# Patient Record
Sex: Female | Born: 1935 | Race: White | Hispanic: No | Smoking: Never smoker
Health system: Southern US, Community
[De-identification: ages and names within clinical notes are randomized; demographics above are authoritative.]

## PROBLEM LIST (undated history)

## (undated) DIAGNOSIS — K5730 Diverticulosis of large intestine without perforation or abscess without bleeding: Secondary | ICD-10-CM

## (undated) DIAGNOSIS — E785 Hyperlipidemia, unspecified: Secondary | ICD-10-CM

## (undated) DIAGNOSIS — H353190 Nonexudative age-related macular degeneration, unspecified eye, stage unspecified: Secondary | ICD-10-CM

## (undated) DIAGNOSIS — R32 Unspecified urinary incontinence: Secondary | ICD-10-CM

## (undated) DIAGNOSIS — I2510 Atherosclerotic heart disease of native coronary artery without angina pectoris: Secondary | ICD-10-CM

## (undated) DIAGNOSIS — I517 Cardiomegaly: Secondary | ICD-10-CM

## (undated) DIAGNOSIS — E039 Hypothyroidism, unspecified: Secondary | ICD-10-CM

## (undated) DIAGNOSIS — I340 Nonrheumatic mitral (valve) insufficiency: Secondary | ICD-10-CM

## (undated) DIAGNOSIS — M1990 Unspecified osteoarthritis, unspecified site: Secondary | ICD-10-CM

## (undated) DIAGNOSIS — G459 Transient cerebral ischemic attack, unspecified: Secondary | ICD-10-CM

## (undated) DIAGNOSIS — I272 Pulmonary hypertension, unspecified: Secondary | ICD-10-CM

## (undated) DIAGNOSIS — K219 Gastro-esophageal reflux disease without esophagitis: Secondary | ICD-10-CM

## (undated) DIAGNOSIS — R55 Syncope and collapse: Secondary | ICD-10-CM

## (undated) DIAGNOSIS — I422 Other hypertrophic cardiomyopathy: Secondary | ICD-10-CM

## (undated) DIAGNOSIS — M549 Dorsalgia, unspecified: Secondary | ICD-10-CM

## (undated) DIAGNOSIS — I10 Essential (primary) hypertension: Secondary | ICD-10-CM

## (undated) DIAGNOSIS — Z9889 Other specified postprocedural states: Secondary | ICD-10-CM

## (undated) DIAGNOSIS — D126 Benign neoplasm of colon, unspecified: Secondary | ICD-10-CM

## (undated) HISTORY — DX: Gastro-esophageal reflux disease without esophagitis: K21.9

## (undated) HISTORY — DX: Pulmonary hypertension, unspecified: I27.20

## (undated) HISTORY — DX: Benign neoplasm of colon, unspecified: D12.6

## (undated) HISTORY — DX: Nonrheumatic mitral (valve) insufficiency: I34.0

## (undated) HISTORY — DX: Dorsalgia, unspecified: M54.9

## (undated) HISTORY — DX: Other hypertrophic cardiomyopathy: I42.2

## (undated) HISTORY — DX: Essential (primary) hypertension: I10

## (undated) HISTORY — DX: Unspecified urinary incontinence: R32

## (undated) HISTORY — DX: Cardiomegaly: I51.7

## (undated) HISTORY — PX: OTHER SURGICAL HISTORY: SHX169

## (undated) HISTORY — PX: DILATION AND CURETTAGE OF UTERUS: SHX78

## (undated) HISTORY — PX: CATARACT EXTRACTION, BILATERAL: SHX1313

## (undated) HISTORY — DX: Atherosclerotic heart disease of native coronary artery without angina pectoris: I25.10

## (undated) HISTORY — DX: Diverticulosis of large intestine without perforation or abscess without bleeding: K57.30

## (undated) HISTORY — PX: LUNG BIOPSY: SHX232

## (undated) HISTORY — DX: Other specified postprocedural states: Z98.890

## (undated) HISTORY — DX: Syncope and collapse: R55

## (undated) HISTORY — PX: COLONOSCOPY: SHX174

## (undated) HISTORY — DX: Transient cerebral ischemic attack, unspecified: G45.9

---

## 1998-01-26 ENCOUNTER — Encounter: Payer: Self-pay | Admitting: Internal Medicine

## 1998-01-26 ENCOUNTER — Ambulatory Visit (HOSPITAL_COMMUNITY): Admission: RE | Admit: 1998-01-26 | Discharge: 1998-01-26 | Payer: Self-pay | Admitting: Obstetrics & Gynecology

## 1998-01-26 ENCOUNTER — Ambulatory Visit (HOSPITAL_COMMUNITY): Admission: RE | Admit: 1998-01-26 | Discharge: 1998-01-26 | Payer: Self-pay | Admitting: Internal Medicine

## 1999-01-25 ENCOUNTER — Ambulatory Visit (HOSPITAL_COMMUNITY): Admission: RE | Admit: 1999-01-25 | Discharge: 1999-01-25 | Payer: Self-pay | Admitting: Obstetrics & Gynecology

## 1999-01-25 ENCOUNTER — Ambulatory Visit (HOSPITAL_COMMUNITY): Admission: RE | Admit: 1999-01-25 | Discharge: 1999-01-25 | Payer: Self-pay | Admitting: Internal Medicine

## 1999-03-12 ENCOUNTER — Encounter: Payer: Self-pay | Admitting: Internal Medicine

## 1999-03-12 ENCOUNTER — Ambulatory Visit (HOSPITAL_COMMUNITY): Admission: RE | Admit: 1999-03-12 | Discharge: 1999-03-12 | Payer: Self-pay | Admitting: Internal Medicine

## 2000-03-13 ENCOUNTER — Ambulatory Visit (HOSPITAL_COMMUNITY): Admission: RE | Admit: 2000-03-13 | Discharge: 2000-03-13 | Payer: Self-pay | Admitting: *Deleted

## 2000-03-13 ENCOUNTER — Ambulatory Visit (HOSPITAL_COMMUNITY): Admission: RE | Admit: 2000-03-13 | Discharge: 2000-03-13 | Payer: Self-pay | Admitting: Internal Medicine

## 2001-04-09 ENCOUNTER — Ambulatory Visit (HOSPITAL_COMMUNITY): Admission: RE | Admit: 2001-04-09 | Discharge: 2001-04-09 | Payer: Self-pay | Admitting: Internal Medicine

## 2001-04-09 ENCOUNTER — Ambulatory Visit (HOSPITAL_COMMUNITY): Admission: RE | Admit: 2001-04-09 | Discharge: 2001-04-09 | Payer: Self-pay | Admitting: Obstetrics & Gynecology

## 2002-04-12 ENCOUNTER — Ambulatory Visit (HOSPITAL_COMMUNITY): Admission: RE | Admit: 2002-04-12 | Discharge: 2002-04-12 | Payer: Self-pay | Admitting: Internal Medicine

## 2003-03-03 ENCOUNTER — Encounter (INDEPENDENT_AMBULATORY_CARE_PROVIDER_SITE_OTHER): Payer: Self-pay | Admitting: Specialist

## 2003-03-03 ENCOUNTER — Ambulatory Visit: Admission: RE | Admit: 2003-03-03 | Discharge: 2003-03-03 | Payer: Self-pay | Admitting: Internal Medicine

## 2004-04-19 ENCOUNTER — Ambulatory Visit: Payer: Self-pay | Admitting: Internal Medicine

## 2004-07-17 ENCOUNTER — Ambulatory Visit: Payer: Self-pay | Admitting: Internal Medicine

## 2004-12-27 ENCOUNTER — Ambulatory Visit: Payer: Self-pay | Admitting: Internal Medicine

## 2005-01-20 ENCOUNTER — Ambulatory Visit: Payer: Self-pay | Admitting: Internal Medicine

## 2005-02-06 ENCOUNTER — Encounter (INDEPENDENT_AMBULATORY_CARE_PROVIDER_SITE_OTHER): Payer: Self-pay | Admitting: Specialist

## 2005-02-06 ENCOUNTER — Ambulatory Visit: Payer: Self-pay | Admitting: Internal Medicine

## 2005-02-06 DIAGNOSIS — D126 Benign neoplasm of colon, unspecified: Secondary | ICD-10-CM

## 2005-02-06 HISTORY — DX: Benign neoplasm of colon, unspecified: D12.6

## 2005-06-25 ENCOUNTER — Ambulatory Visit: Payer: Self-pay | Admitting: Internal Medicine

## 2005-06-27 ENCOUNTER — Ambulatory Visit: Payer: Self-pay | Admitting: Cardiology

## 2005-09-17 ENCOUNTER — Ambulatory Visit: Payer: Self-pay | Admitting: Internal Medicine

## 2005-11-12 ENCOUNTER — Encounter (INDEPENDENT_AMBULATORY_CARE_PROVIDER_SITE_OTHER): Payer: Self-pay | Admitting: *Deleted

## 2005-11-12 ENCOUNTER — Ambulatory Visit: Payer: Self-pay | Admitting: Internal Medicine

## 2005-11-12 ENCOUNTER — Ambulatory Visit: Admission: RE | Admit: 2005-11-12 | Discharge: 2005-11-12 | Payer: Self-pay | Admitting: Internal Medicine

## 2005-11-20 ENCOUNTER — Ambulatory Visit: Payer: Self-pay | Admitting: Internal Medicine

## 2006-01-01 ENCOUNTER — Ambulatory Visit: Payer: Self-pay | Admitting: Internal Medicine

## 2006-01-29 ENCOUNTER — Ambulatory Visit: Payer: Self-pay | Admitting: Internal Medicine

## 2006-06-30 ENCOUNTER — Ambulatory Visit: Payer: Self-pay | Admitting: Internal Medicine

## 2006-09-30 ENCOUNTER — Ambulatory Visit: Payer: Self-pay | Admitting: Vascular Surgery

## 2006-10-07 ENCOUNTER — Ambulatory Visit: Payer: Self-pay | Admitting: Vascular Surgery

## 2006-10-28 ENCOUNTER — Ambulatory Visit: Payer: Self-pay | Admitting: Vascular Surgery

## 2006-11-04 ENCOUNTER — Ambulatory Visit: Payer: Self-pay | Admitting: Vascular Surgery

## 2006-12-18 ENCOUNTER — Ambulatory Visit: Payer: Self-pay | Admitting: Vascular Surgery

## 2006-12-30 DIAGNOSIS — J479 Bronchiectasis, uncomplicated: Secondary | ICD-10-CM

## 2006-12-30 DIAGNOSIS — E785 Hyperlipidemia, unspecified: Secondary | ICD-10-CM

## 2006-12-30 DIAGNOSIS — E039 Hypothyroidism, unspecified: Secondary | ICD-10-CM

## 2006-12-31 ENCOUNTER — Ambulatory Visit: Payer: Self-pay | Admitting: Internal Medicine

## 2007-05-27 ENCOUNTER — Encounter: Admission: RE | Admit: 2007-05-27 | Discharge: 2007-05-27 | Payer: Self-pay | Admitting: Internal Medicine

## 2007-09-15 ENCOUNTER — Encounter: Admission: RE | Admit: 2007-09-15 | Discharge: 2007-09-15 | Payer: Self-pay | Admitting: Cardiology

## 2007-09-16 ENCOUNTER — Ambulatory Visit: Payer: Self-pay | Admitting: Surgery

## 2008-02-09 ENCOUNTER — Ambulatory Visit: Payer: Self-pay | Admitting: Internal Medicine

## 2008-09-24 HISTORY — PX: CARDIAC CATHETERIZATION: SHX172

## 2008-10-20 ENCOUNTER — Inpatient Hospital Stay (HOSPITAL_BASED_OUTPATIENT_CLINIC_OR_DEPARTMENT_OTHER): Admission: RE | Admit: 2008-10-20 | Discharge: 2008-10-20 | Payer: Self-pay | Admitting: Cardiology

## 2008-11-01 ENCOUNTER — Ambulatory Visit: Payer: Self-pay | Admitting: Vascular Surgery

## 2008-11-03 ENCOUNTER — Observation Stay (HOSPITAL_COMMUNITY): Admission: EM | Admit: 2008-11-03 | Discharge: 2008-11-04 | Payer: Self-pay | Admitting: Emergency Medicine

## 2008-11-16 ENCOUNTER — Encounter: Admission: RE | Admit: 2008-11-16 | Discharge: 2008-11-16 | Payer: Self-pay | Admitting: Cardiology

## 2009-10-16 ENCOUNTER — Ambulatory Visit: Payer: Self-pay | Admitting: Cardiology

## 2009-11-27 ENCOUNTER — Encounter: Admission: RE | Admit: 2009-11-27 | Discharge: 2009-11-27 | Payer: Self-pay | Admitting: Internal Medicine

## 2010-02-19 ENCOUNTER — Ambulatory Visit: Payer: Self-pay | Admitting: Cardiology

## 2010-03-11 ENCOUNTER — Ambulatory Visit: Payer: Self-pay | Admitting: Cardiology

## 2010-04-09 ENCOUNTER — Ambulatory Visit (INDEPENDENT_AMBULATORY_CARE_PROVIDER_SITE_OTHER): Payer: Medicare HMO | Admitting: *Deleted

## 2010-04-09 DIAGNOSIS — I10 Essential (primary) hypertension: Secondary | ICD-10-CM

## 2010-04-09 DIAGNOSIS — R5381 Other malaise: Secondary | ICD-10-CM

## 2010-04-09 DIAGNOSIS — R5383 Other fatigue: Secondary | ICD-10-CM

## 2010-05-31 LAB — CARDIAC PANEL(CRET KIN+CKTOT+MB+TROPI)
CK, MB: 2.5 ng/mL (ref 0.3–4.0)
CK, MB: 2.9 ng/mL (ref 0.3–4.0)
Relative Index: 2.1 (ref 0.0–2.5)
Troponin I: 0.02 ng/mL (ref 0.00–0.06)

## 2010-05-31 LAB — CBC
HCT: 36.5 % (ref 36.0–46.0)
Hemoglobin: 12.3 g/dL (ref 12.0–15.0)
Hemoglobin: 13.1 g/dL (ref 12.0–15.0)
MCHC: 33.3 g/dL (ref 30.0–36.0)
MCHC: 33.5 g/dL (ref 30.0–36.0)
MCV: 91.5 fL (ref 78.0–100.0)
Platelets: 215 10*3/uL (ref 150–400)
RBC: 3.99 MIL/uL (ref 3.87–5.11)
RDW: 13.6 % (ref 11.5–15.5)
WBC: 6.4 10*3/uL (ref 4.0–10.5)

## 2010-05-31 LAB — URINALYSIS, ROUTINE W REFLEX MICROSCOPIC
Glucose, UA: NEGATIVE mg/dL
Hgb urine dipstick: NEGATIVE
Protein, ur: NEGATIVE mg/dL
pH: 7 (ref 5.0–8.0)

## 2010-05-31 LAB — COMPREHENSIVE METABOLIC PANEL
Albumin: 2.8 g/dL — ABNORMAL LOW (ref 3.5–5.2)
BUN: 9 mg/dL (ref 6–23)
Calcium: 8.8 mg/dL (ref 8.4–10.5)
Glucose, Bld: 143 mg/dL — ABNORMAL HIGH (ref 70–99)
Sodium: 137 mEq/L (ref 135–145)
Total Protein: 6.7 g/dL (ref 6.0–8.3)

## 2010-05-31 LAB — DIFFERENTIAL
Basophils Relative: 1 % (ref 0–1)
Eosinophils Absolute: 0.2 10*3/uL (ref 0.0–0.7)
Eosinophils Relative: 3 % (ref 0–5)
Lymphs Abs: 1.2 10*3/uL (ref 0.7–4.0)
Monocytes Absolute: 0.4 10*3/uL (ref 0.1–1.0)
Neutro Abs: 4.5 10*3/uL (ref 1.7–7.7)

## 2010-05-31 LAB — BASIC METABOLIC PANEL
CO2: 27 mEq/L (ref 19–32)
Chloride: 104 mEq/L (ref 96–112)
Creatinine, Ser: 0.83 mg/dL (ref 0.4–1.2)
GFR calc Af Amer: >60 mL/min (ref >60)
Potassium: 4.2 mEq/L (ref 3.5–5.1)

## 2010-05-31 LAB — APTT: aPTT: 37 seconds (ref 24–37)

## 2010-05-31 LAB — TROPONIN I: Troponin I: 0.02 ng/mL (ref 0.00–0.06)

## 2010-05-31 LAB — CK TOTAL AND CKMB (NOT AT ARMC): Total CK: 114 U/L (ref 7–177)

## 2010-05-31 LAB — POCT CARDIAC MARKERS
CKMB, poc: 1.2 ng/mL (ref 1.0–8.0)
Myoglobin, poc: 135 ng/mL (ref 12–200)
Troponin i, poc: <0.05 ng/mL (ref 0.00–0.09)

## 2010-06-20 ENCOUNTER — Telehealth: Payer: Self-pay | Admitting: Cardiology

## 2010-06-20 NOTE — Telephone Encounter (Signed)
Toprol/not generic  Pls.call to her mail order pharm

## 2010-06-20 NOTE — Telephone Encounter (Signed)
Toprol XL 100 mg one po daily # 90 sent in to [facility].  Pt aware.

## 2010-07-04 ENCOUNTER — Encounter: Payer: Self-pay | Admitting: Cardiology

## 2010-07-04 DIAGNOSIS — I2510 Atherosclerotic heart disease of native coronary artery without angina pectoris: Secondary | ICD-10-CM | POA: Insufficient documentation

## 2010-07-04 DIAGNOSIS — I272 Pulmonary hypertension, unspecified: Secondary | ICD-10-CM | POA: Insufficient documentation

## 2010-07-04 DIAGNOSIS — G459 Transient cerebral ischemic attack, unspecified: Secondary | ICD-10-CM | POA: Insufficient documentation

## 2010-07-04 DIAGNOSIS — I10 Essential (primary) hypertension: Secondary | ICD-10-CM | POA: Insufficient documentation

## 2010-07-04 DIAGNOSIS — M549 Dorsalgia, unspecified: Secondary | ICD-10-CM | POA: Insufficient documentation

## 2010-07-04 DIAGNOSIS — K209 Esophagitis, unspecified without bleeding: Secondary | ICD-10-CM | POA: Insufficient documentation

## 2010-07-04 DIAGNOSIS — I422 Other hypertrophic cardiomyopathy: Secondary | ICD-10-CM | POA: Insufficient documentation

## 2010-07-04 DIAGNOSIS — Z9889 Other specified postprocedural states: Secondary | ICD-10-CM | POA: Insufficient documentation

## 2010-07-04 DIAGNOSIS — K219 Gastro-esophageal reflux disease without esophagitis: Secondary | ICD-10-CM | POA: Insufficient documentation

## 2010-07-04 DIAGNOSIS — R0602 Shortness of breath: Secondary | ICD-10-CM | POA: Insufficient documentation

## 2010-07-04 DIAGNOSIS — R55 Syncope and collapse: Secondary | ICD-10-CM | POA: Insufficient documentation

## 2010-07-04 DIAGNOSIS — R519 Headache, unspecified: Secondary | ICD-10-CM | POA: Insufficient documentation

## 2010-07-04 DIAGNOSIS — R32 Unspecified urinary incontinence: Secondary | ICD-10-CM | POA: Insufficient documentation

## 2010-07-04 DIAGNOSIS — I340 Nonrheumatic mitral (valve) insufficiency: Secondary | ICD-10-CM | POA: Insufficient documentation

## 2010-07-04 DIAGNOSIS — R51 Headache: Secondary | ICD-10-CM | POA: Insufficient documentation

## 2010-07-09 NOTE — Cardiovascular Report (Signed)
[name], [name]                 ACCOUNT NO.:  [id number]   MEDICAL RECORD NO.:  [id number]          PATIENT TYPE:  OIB   LOCATION:  1962                         FACILITY:  [facility]   PHYSICIAN:  Colleen Can. Deborah Chalk, M.D.DATE OF BIRTH:  30-May-1924   DATE OF PROCEDURE:  10/20/2008  DATE OF DISCHARGE:  10/20/2008                            CARDIAC CATHETERIZATION   PROCEDURES:  Left heart catheterization with selective coronary  angiography and left ventricular angiography.   SITE OF ENTRY:  Percutaneous right femoral artery.   CATHETERS:  4-French 4 curved Judkins left coronary catheter, 3D RC  right coronary catheter, and 4-French pigtail ventriculographic  catheter.   CONTRAST MATERIAL:  Omnipaque.   MEDICATIONS GIVEN PRIOR TO PROCEDURE:  Valium 10 mg p.o.   MEDICATIONS GIVEN DURING PROCEDURE:  1. Versed 2 mg IV.  2. Labetalol 10 mg IV.   COMMENTS:  The patient tolerated the procedure well.   HEMODYNAMIC DATA:  The aortic pressure was 178/78, LV was 170/20.  There  is no aortic valve gradient noted on pullback.   ANGIOGRAPHIC DATA:  Left ventricular angiogram was performed in the RAO  position.  Overall cardiac size and silhouette are normal.  The global  ejection fraction is estimated to be 70%.   CORONARY ARTERIES:  Coronary arteries arise and distribute normally, but  there was prominent thebesian vein flow to the point that the left  ventricular angiogram can almost be identified with an injection of both  the right coronary artery and the left coronary artery.  This is  certainly more prominent than normal.   1. Left main coronary artery has 30-40% distal narrowing.  2. Left anterior descending is tortuous with irregularities.  There is      a proximal systolic bridging reducing diameter to approximately 50%      proximally.  There is no significant focal stenosis, otherwise.  3. Left circumflex is tortuous with irregularities.  4. Right coronary artery has 30-50%  narrowing proximally.  It is a      dominant vessel.   All coronaries have prominent thebesian flow.   OVERALL IMPRESSION:  1. Normal left ventricular function, but with mild elevation of left      ventricular end-diastolic pressure.  2. Mild-to-moderate coronary atherosclerosis with 30-50% narrowing in      the right coronary artery, 30-40% narrowing in the left main      coronary artery, and irregularities, otherwise.  3. 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  4. Prominent thebesian vein flow.   DISCUSSION:  Laurie [name] does not appear to need any type of intervention  at this point in time and will need to be managed medically.  She has  had a history of mild septal hypertrophy with her last echocardiogram  and mild hypertension.  She is on prominent beta-blocker at this point  in time, and I think that probably is adequate therapy for now.  Literature will need to be reviewed.      Colleen Can. Deborah Chalk, M.D.  Electronically Signed     SNT/MEDQ  D:  10/20/2008  T:  10/21/2008  Job:  [id number]

## 2010-07-09 NOTE — Assessment & Plan Note (Signed)
OFFICE VISIT   Leitch, Bowie A  DOB:  [birth year]                                       12/18/2006  UXLKG#:[id number]   Laurie [name] presents today for final follow-up after her right leg laser  ablation staph phlebectomy for severe venous hypertension and varicose  veins.  She has resolved all the usual postoperative bruising.  Her  procedure was on 10/28/06. She does have some continued mild amount of  blood under the skin and this will continue to resolve.  She is pleased  with her result as am I.  We will see her again on an as needed basis.   Larina Earthly, M.D.  Electronically Signed   TFE/MEDQ  D:  12/18/2006  T:  12/22/2006  Job:  [id number]   cc:   Eber Hong

## 2010-07-09 NOTE — Procedures (Signed)
CAROTID DUPLEX EXAM   INDICATION:  TIA.   HISTORY:  Diabetes:  No.  Cardiac:  No.  Hypertension:  Yes.  Smoking:  No.  Previous Surgery:  No.  CV History:  Patient complains of left-sided weakness.  Previous duplex  on 08/03/07 revealed 20-39% ICA stenosis bilaterally.  Amaurosis Fugax No, Paresthesias No, Hemiparesis No.                                       RIGHT             LEFT  Brachial systolic pressure:         178               178  Brachial Doppler waveforms:         Triphasic         Triphasic  Vertebral direction of flow:        Antegrade         Antegrade  DUPLEX VELOCITIES (cm/sec)  CCA peak systolic                   52                50  ECA peak systolic                   43                41  ICA peak systolic                   39                65  ICA end diastolic                   10                17  PLAQUE MORPHOLOGY:                  Soft              Soft  PLAQUE AMOUNT:                      Mild              Mild  PLAQUE LOCATION:                    Proximal ICA      Proximal ICA   IMPRESSION:  1. 20-39% internal carotid artery stenosis bilaterally.  2. No significant change from the previous study.   ___________________________________________  Larina Earthly, M.D.   MC/MEDQ  D:  09/16/2007  T:  09/16/2007  Job:  [id number]

## 2010-07-09 NOTE — Assessment & Plan Note (Signed)
[facility]                             PULMONARY OFFICE NOTE   NAME:Laurie [name], Laurie [name]                        MRN:          [id number]  DATE:12/31/2006                            DOB:          10-Jan-1925    HISTORY:  A 77 year old white female, never smoker, with bilateral  airspace disease involving the right upper lobe and lingula, documented  since November of 2004 with bronchoscopy x2 with no specific findings.  She returns today saying she has not had any cough or chest pain,  fevers, chills, sweats, unintended weight loss, orthopnea, PND or leg  swelling or dyspnea since her previous visit and returns for followup  chest x-ray as requested.   For full inventory of medications, please see face sheet, column dated  December 31, 2006.  She has a medication calendar generated from our  office, but is not carrying it with her to the doctor's as I had  recommended.   PHYSICAL EXAMINATION:  She is a pleasant, ambulatory white female in no  acute distress.  She is afebrile with normal vital signs.  HEENT:  Unremarkable.  Oropharynx is clear.  Lung fields are clear bilaterally to auscultation and percussion.  I  cannot appreciate any adventitious breath sounds at all.  HEART:  Regular rhythm without murmurs, gallops, or rubs.  ABDOMEN:  Soft, benign.  EXTREMITIES:  Warm without calf tenderness, cyanosis, clubbing, or  edema.   Chest x-ray is pending.   IMPRESSION:  Bronchiectasis with scarring involving the right upper lobe  and lingula.  Although radiology has raised the issue of alveolar cell  carcinoma, I think this is extremely unlikely based on the 2 negative  bronchoscopies that she has had, and would recommend followup chest x-  ray at 6 months unless symptoms develop in the meantime.   I note that she does have p.r.n. Mucinex to use for cough and  congestion, but has not found that she needs it lately.  I have deferred  vaccinations to Dr.  Lanell Matar capable hands.     Charlaine Dalton. Sherene Sires, MD, [facility]  Electronically Signed    MBW/MedQ  DD: 12/31/2006  DT: 12/31/2006  Job #: [id number]   cc:   Joni Fears D. Maple Hudson, MD, FCCP, FACP

## 2010-07-09 NOTE — H&P (Signed)
[name], [name]                 ACCOUNT NO.:  [id number]   MEDICAL RECORD NO.:  [id number]          PATIENT TYPE:  JCAR   LOCATION:                               FACILITY:  [facility]   PHYSICIAN:  Colleen Can. Deborah Chalk, M.D.DATE OF BIRTH:  [birth year]   DATE OF ADMISSION:  10/20/2008  DATE OF DISCHARGE:                              HISTORY & PHYSICAL   CHIEF COMPLAINT:  Chest pain.   HISTORY OF PRESENT ILLNESS:  Laurie [name] is a very pleasant elderly 77 year old  white female who is referred for cardiac catheterization.  She has had  intermittent chest discomfort off and on for quite some time, but then  had significant episode this past Thursday evening.  She notes that when  she went to go to bed Thursday night, she had substernal chest  discomfort.  It was associated with some nausea.  She got extremely  worried.  She took 2 Tums with no relief and then subsequently took 4  baby aspirin and was able to drift off to sleep.  The discomfort lasted  for at least 1 hour.  She has had recurrent twinges since that time.  Her EKG performed here in the office does show T-wave changes  anterolaterally.  She is now referred for cardiac catheterization.   PAST MEDICAL HISTORY:  1. Hypertension.  2. Mild septal hypertrophy with her last echo in July 2009.  3. Moderate mitral regurgitation.  4. Mild pulmonary hypertension.  5. Situational stress with husband's death in 09-Mar-2007.  6. Remote TIA.  7. Remote history of syncope.  8. Vein stripping in 1970.  9. Childbirth x4.  10.Bilateral cataract surgery.   ALLERGIES:  AMOXICILLIN AND LODINE.   CURRENT MEDICINES:  Lopid 600 b.i.d., Synthroid 50 mcg daily,  multivitamin daily, Toprol XL 200 mg a day, Aleve as needed, Diovan 160  a day, Tylenol Arthritis p.r.n., baby aspirin daily, and vitamin C and D  daily.   FAMILY HISTORY:  Father died of prostate cancer and hypertension at the  age of 72.  Her mother died of heart disease at age 43.  She has  had  brothers who have had bypass surgery.   SOCIAL HISTORY:  She is retired from being overlord.  She does not use  any tobacco or alcohol products.   REVIEW OF SYSTEMS:  Positive for fatigue, shortness of breath with  walking upstairs, some dry cough, bruising easily, back pain, and  weakness.  All other review of systems are negative.   PHYSICAL EXAMINATION:  GENERAL:  On exam, she is pleasant elderly white  female in no acute distress.  She is currently pain free.  VITAL SIGNS:  Weight 138 pounds.  Blood pressure 122/76 sitting and  118/72 standing.  Heart rate 56 and regular.  SKIN:  Warm and dry.  Color is unremarkable.  HEENT:  Normocephalic, atraumatic.  Pupils are equal and reactive.  Sclerae is nonicteric.  NECK:  Supple with no masses and no JVD.  LUNGS: Clear.  CARDIAC:  Shows a regular rhythm.  She does have soft systolic outflow  murmur.  ABDOMEN:  Soft, positive bowel sounds, nontender.  EXTREMITIES: Without edema.  M/S: Gait and range of motion are intact.  NEURO:  No gross focal deficits.   IMAGING:  EKG shows sinus rhythm with anterolateral T-wave changes.   OVERALL IMPRESSION:  1. Prolonged episode of chest pain.  2. Abnormal EKG.  3. Hypertension.  4. Hyperlipidemia.  5. Hypothyroidism.   PLAN:  We will proceed on with diagnostic cardiac catheterization.  The  procedure, risks, and benefits have all been reviewed and she is willing  to proceed on Friday, October 20, 2008.      Sharlee Blew, N.P.      Colleen Can. Deborah Chalk, M.D.  Electronically Signed    LC/MEDQ  D:  10/17/2008  T:  10/18/2008  Job:  [id number]   cc:   Eber Hong

## 2010-07-09 NOTE — Consult Note (Signed)
NEW PATIENT CONSULTATION   [name], Laurie A  DOB:  [birth year]                                       09/30/2006  EAVWU#:[id number]   Laurie [name] presents today for evaluation of right leg venous  varicosities. She is a very pleasant, healthy, 77 year old white female  with progressive pain over her right leg varicosities. She has a long  history of left leg varicosities, having undergo great saphenous vein  stripping from her ankle to her groin 40 years ago by Dr. Terri Piedra. She  had a venous ulcer and has had excellent long-term result from this. She  has had progressive changes of tributary varicosities over her right  leg. She reports pain most particularly over her pretibial area with a  nest of tributary varicosity at this area and also over her ankle. She  has not had any history of deep vein thrombosis or superficial  thrombophlebitis. She does have swelling at the end of the day more so  in her right leg than her left leg. She does take Tylenol for  discomfort. Ibuprofen upsets her stomach. She elevates her leg when  possible as well. She has worn graduated compression stockings for over  three months now and has had no improvement from this.   Her past history is significant for hypertension, elevated cholesterol.  She has no cardiac history. She is not diabetic.   She is married. Does not smoke or drink alcohol.   REVIEW OF SYSTEMS:  Positive only for constipation and arthritis.   ALLERGIES:  TO AMOXICILLIN AND LODINE.   On physical exam, a well-developed, well-nourished white female  appearing younger than her stated age of 55. Her blood pressure is  178/91, pulse 60, respirations 18. Her dorsalis pedis pulses are 2+  bilaterally. She does have an incision over her left medial malleolus  from her prior vein stripping and does have some spider vein  telangiectasia and small reticular varicosities over her left leg. On  her right leg, she has marked  saphenous tributary varicosities at her  medial knee extending into her pretibial area and extensive varicosities  down into her foot. The area of the varicosities crosses her ankle and  is very superficial with some superficial excoriation of her skin. She  has never had bleeding from these, but I am concerned that these do look  like typical type of veins that will bleed. I discussed this with [name] and explained the importance of local direct pressure if she  should ever have bleeding from these or any other varicosities.   She underwent hand-held duplex by me in the office, and this revealed  gross reflux throughout her saphenous vein. I would recommend that we  proceed with a formal duplex evaluation to evaluate her deep vein  superficial system. She does appear to be an excellent candidate for  laser ablation and stab phlebectomy for correction of her venous  hypertension. I explained this procedure to her, which is outpatient in  our office under local anesthesia. We will proceed with a duplex, and if  this confirms indication for laser ablation and stab phlebectomy, we  will proceed with this once we have preapproved this with her insurance  carrier.   Larina Earthly, M.D.  Electronically Signed   TFE/MEDQ  D:  09/30/2006  T:  10/01/2006  Job:  [id number]   cc:   Geoffry Paradise, M.D.

## 2010-07-09 NOTE — Assessment & Plan Note (Signed)
OFFICE VISIT   [name], [name]  DOB:  17-Nov-1924                                       11/04/2006  ZOXWR#:[id number]   The patient presents today for followup of her right great saphenous  vein laser ablation and stab phlebectomy, and also sclerotherapy to her  superficial veins on her ankles.  She has had an excellent initial  result with minimal discomfort.  She has the usual amount of mild  bruising and mild discomfort.  She underwent repeat limited duplex by me  today, and this reveals complete occlusion of her saphenous vein from  her knee to her saphenofemoral junction, and wide patency of her common  femoral veins with no evidence of thrombus.  I am quite pleased with her  initial results.  She will continue her usual activities.  We will see  her again in 6 weeks for final followup.   Larina Earthly, M.D.  Electronically Signed   TFE/MEDQ  D:  11/04/2006  T:  11/05/2006  Job:  [id number]   cc:   Geoffry Paradise, M.D.

## 2010-07-09 NOTE — Procedures (Signed)
VASCULAR LAB EXAM   INDICATION:  Status post heart cath with pain in the right groin.   HISTORY:  Diabetes:  No.  Cardiac:  Yes.  Hypertension:  Yes.   EXAM:  Duplex of the right groin.   IMPRESSION:  No evaluation of pseudoaneurysm and / or arteriovenous  fistula is noted.   ___________________________________________  Larina Earthly, M.D.   MG/MEDQ  D:  11/01/2008  T:  11/01/2008  Job:  [id number]

## 2010-07-09 NOTE — Procedures (Signed)
LOWER EXTREMITY VENOUS REFLUX EXAM   INDICATION:  Right leg varicose vein. The left greater saphenous vein  had previously been stripped from the ankle to the groin.   EXAM:  Using color-flow imaging and pulse Doppler spectral analysis, the  right common femoral, superficial femoral, popliteal, posterior tibial,  greater and lesser saphenous veins are evaluated. There is deep  incompentency involving the CFV with the remainder of the deep veins  demonstrating compentency above and below the knee.   The right saphenofemoral junction is not competent.  The right GSV is  not competent with the caliber as described below.   The right proximal short saphenous vein demonstrates competency.   GSV Diameter (used if found to be incompetent only)                                            Right    Left  Proximal Greater Saphenous Vein           0.8 cm   cm  Proximal-to-mid-thigh                     0.6 cm   cm  Mid thigh                                 0.5 cm   cm  Mid-distal thigh                          0.4 cm   cm  Distal thigh                              0.5 cm   cm  Knee                                      0.5 cm   cm   IMPRESSION:  1. Right greater saphenous vein reflux is identified with the caliber      ranging from 0.4 cm to 0.8 cm knee to groin.  2. The right greater saphenous vein is not aneurysmal.  3. The right greater saphenous vein is not tortuous.  4. There is no evidence of significant deep venous insufficiency in      the left lower extremity.  5. The right lesser saphenous vein is competent.  6. The right CFV is incompetent with the remainder of the deep veins      demostrating compentcy.   ___________________________________________  Gretta Began, MD  MC/MEDQ  D:  10/07/2006  T:  10/08/2006  Job:  [id number]

## 2010-07-10 ENCOUNTER — Encounter: Payer: Self-pay | Admitting: Cardiology

## 2010-07-10 ENCOUNTER — Ambulatory Visit (INDEPENDENT_AMBULATORY_CARE_PROVIDER_SITE_OTHER): Payer: Medicare HMO | Admitting: Cardiology

## 2010-07-10 VITALS — BP 158/88 | HR 60 | Ht 64.0 in | Wt 135.0 lb

## 2010-07-10 DIAGNOSIS — I422 Other hypertrophic cardiomyopathy: Secondary | ICD-10-CM

## 2010-07-10 DIAGNOSIS — I10 Essential (primary) hypertension: Secondary | ICD-10-CM

## 2010-07-10 NOTE — Assessment & Plan Note (Signed)
Her last echocardiogram showed septal thickness of 13 mm and LV posterior wall thickness of 9 mm. She does not have outflow gradient. She has nonobstructive coronary atherosclerosis and prominent thebesian veins.

## 2010-07-10 NOTE — Assessment & Plan Note (Signed)
Blood pressure has been well-controlled at home. She's not had recurrent syncope.

## 2010-07-10 NOTE — Progress Notes (Signed)
Subjective:   Laurie [name] is seen today for followup. In general, she's been doing well with no recurrence of chest pain. 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 She also had myocardial bridging.her other problems include hypertension, septal hypertrophy, mitral regurgitation, pulmonary hypertension, remote TIA, and remote history of syncope. In general, she's been doing reasonably well with adequate blood pressure control. She doesn't have any significant complaints today.  Current Outpatient Prescriptions  Medication Sig Dispense Refill  . Acetaminophen (TYLENOL ARTHRITIS PAIN PO) Take by mouth as needed.        Marland Kitchen amLODipine (NORVASC) 2.5 MG tablet Take 2.5 mg by mouth daily.        Marland Kitchen aspirin 81 MG tablet Take 81 mg by mouth daily.        Marland Kitchen gemfibrozil (LOPID) 600 MG tablet Take 600 mg by mouth 2 (two) times daily before a meal.        . levothyroxine (SYNTHROID, LEVOTHROID) 75 MCG tablet Take 75 mcg by mouth daily.        . metoprolol (TOPROL-XL) 100 MG 24 hr tablet Take 100 mg by mouth daily.        . Multiple Vitamin (MULTIVITAMIN) tablet Take 1 tablet by mouth daily. occasionally       . valsartan (DIOVAN) 320 MG tablet Take 320 mg by mouth daily.        . naproxen sodium (ANAPROX) 220 MG tablet Take 220 mg by mouth as needed.        Marland Kitchen DISCONTD: omeprazole (PRILOSEC OTC) 20 MG tablet Take 20 mg by mouth as needed.          Allergies  Allergen Reactions  . Amoxicillin   . Aspirin     REACTION: intolerance  . Lodine (Etodolac)     Patient Active Problem List  Diagnoses  . HYPOTHYROIDISM  . HYPERLIPIDEMIA  . BRONCHIECTASIS  . Hypertension  . GERD (gastroesophageal reflux disease)  . Coronary artery disease  . SOB (shortness of breath)  . Incontinence  . Back pain  . Headache  . Asymmetric septal hypertrophy  . Mitral regurgitation  . Pulmonary hypertension  . TIA (transient ischemic  attack)  . Syncope and collapse  . History of vein stripping  . Esophagitis    History  Smoking status  . Never Smoker   Smokeless tobacco  . Never Used    History  Alcohol Use No    Family History  Problem Relation Age of Onset  . Heart disease Mother   . Prostate cancer Father   . Hypertension Father     Review of Systems:   The patient denies any heat or cold intolerance.  No weight gain or weight loss.  The patient denies headaches or blurry vision.  There is no cough or sputum production.  The patient denies dizziness.  There is no hematuria or hematochezia.  The patient denies any muscle aches or arthritis.  The patient denies any rash.  The patient denies frequent falling or instability.  There is no history of depression or anxiety.  All other systems were reviewed and are negative.   Physical Exam:   Vital signs her review. Weight is 135. Blood pressure 158/88 and a whole her blood pressure is 125/70 heart rate is 60.The head is normocephalic and atraumatic.  Pupils are equally round and reactive to light.  Sclerae nonicteric.  Conjunctiva is clear.  Oropharynx is unremarkable.  There's adequate oral airway.  Neck is supple there are no masses.  Thyroid is not enlarged.  There is no lymphadenopathy.  Lungs are clear.  Chest is symmetric.  Heart shows a regular rate and rhythm.  S1 and S2 are normal.  There is no murmur click or gallop.  Abdomen is soft normal bowel sounds.  There is no organomegaly.  Genital and rectal deferred.  Extremities are without edema.  Peripheral pulses are adequate.  Neurologically intact.  Full range of motion.  The patient is not depressed.  Skin is warm and dry.  Assessment / Plan:

## 2010-07-12 NOTE — Op Note (Signed)
NAME:  Laurie [name], Laurie [name]                           ACCOUNT NO.:  [id number]   MEDICAL RECORD NO.:  [id number]                   PATIENT TYPE:  AMB   LOCATION:  CARD                                 FACILITY:  [facility]   PHYSICIAN:  Casimiro Needle B. Sherene Sires, M.D. [facility]           DATE OF BIRTH:  04-06-1924   DATE OF PROCEDURE:  03/03/2003  DATE OF DISCHARGE:                                 OPERATIVE REPORT   PROCEDURE:  Fiberoptic bronchoscopy with transbronchial biopsy of the left  upper lobe and lavage of the right upper lobe.   REFERRED BY:  Geoffry Paradise, M.D.   HISTORY:  This is [name] 77 year old white female with persistent pulmonary  infiltrates and vague chest discomfort but it has already been evaluated in  the office.  Please see full dictated consultation note for further  information. It was felt at this point because of persistence of infiltrates  that she should have [name] transbronchial biopsy of the larger of the two  infiltrates (left upper lobe) and lavage of the smaller of the two  infiltrates (right upper lobe) with [name] full discussion of the risks,  benefits, and alternatives in the office. Since that evaluation in the  office there has been no change on her history exam.   She agreed to the procedure after [name] full discussion of her risks, benefits,  and alternatives.   The procedure was performed in the bronchoscopy suite with continuous  monitoring by surface ECG and oximetry.  The patient maintained adequate  saturations throughout the procedure in sinus rhythm.  She received [name] total  of 2.5 mg of IV Versed for adequate sedation and cough suppression.   The left naris was prepared with 2% lidocaine jelly and the patient was  given an updraft 1% lidocaine nebulizer treatment.   Using [name] standard video fiberoptic bronchoscope, the left naris was easily  cannulated with good visualization of the entire oropharynx and larynx. The  cords moved normally and there were no apparent upper  airway lesions.   Using an additional 1% lidocaine as needed, the entire tracheobronchial tree  was explored bilaterally with the following findings:   All the airways opened widely at the subsegmental level with no  endobronchial lesions.   DESCRIPTION OF PROCEDURE:  Initially the left upper lobe was approached via  transbronchial technique.  The best access to the area of infiltrated was  through the lingula orifice. Two transbronchial biopsies were done using  fluoroscopic technique with moderate bleeding encountered. Therefore  additional biopsies were not attempted. Bleeding was controlled with saline  lavage.   Additionally, the right upper lobe posterior segment was lavaged with  adequate return and samples sent for cytology, AFB and fungal stain and  culture.   IMPRESSION:  Bilateral pulmonary infiltrates of undetermined etiology with [name]  differential diagnosis being inflammatory (BOOP) or neoplastic (alveolar  cell carcinoma).   RECOMMENDATIONS:  Await the above studies.  [name] followup chest x-ray is  pending at the time of this dictation.                                               Charlaine Dalton. Sherene Sires, M.D. [facility]    MBW/MEDQ  D:  03/03/2003  T:  03/03/2003  Job:  [id number]   cc:   Geoffry Paradise, M.D.  [address]  [place]  [place] [address]  Fax: [phone]

## 2010-07-12 NOTE — Assessment & Plan Note (Signed)
[facility]                               PULMONARY OFFICE NOTE   NAME:[name], Laurie MORSS                        MRN:          [id number]  DATE:09/17/2005                            DOB:          Apr 18, 1924    This is a pulmonary session extended followup office visit.   HISTORY:  77 year old white female who first presented in November of 2004  with right sided pleuritic chest pain with evidence of bronchiectasis  with  a bronchoscopy performed March 03, 2003 indicating no evidence of purulent  sputum and no organisms grown to date with negative Cytology's as well.  I  have been following her conservatively.  She has had minimal cough, no  significant elevation of sed rate, fevers, chills, sweats, unintended weight  loss.  However, she comes back today for a routine followup, chest x-ray  after CT scan performed in May suggested that she had increased infiltrates  bilaterally associated with bronchiectasis for consideration for  bronchoscopy.  She says she feels fine.   MEDICATIONS:  Her medications reviewed in detail and on the face sheet dated  September 17, 2005.   EXAMINATION:  GENERAL:  She is a pleasant ambulatory white female in no  acute distress.  VITAL SIGNS:  Weight down 4 pounds to a baseline of 139.  HEENT:  Unremarkable.  NECK:  Supple without cervical adenopathy, tenderness. Trachea midline.  LUNGS:  Clear to auscultation and percussion.  HEART:  Regular rate and rhythm.  No murmurs, rubs, or gallops.  ABDOMEN:  Soft.  Benign.  EXTREMITIES:  No cyanosis, clubbing or edema.   Chest x-ray was reviewed with the patient and indicates definite progression  of the infiltrates on plain film, especially in the lingula.  The right  upper lobe is much more subtle area as it is medial and adjacent to the  mediastinum and difficult to assess.  I also reviewed with her the CT scan  findings from Jun 27, 2005 indicating the same thing.   The  differential diagnosis includes very low grade bronchial alveolar  carcinoma (seems unlikely since she has had the problem for 3 years) vs.  more likely an atypical mycobacterial infection such as Mycobacterium avium-  intracellulare.   She tells me she is undergoing cardiac evaluation next week and does not  have time to schedule a bronchoscopy but I did discuss with her the risks,  benefits, alternatives of that and she ultimately did decide to schedule  bronchoscopy once she is clear from a cardiac point-of-view.   She tolerated the procedure previously well except for a mild amount of  bleeding.  On this occasion I would like for her to stop aspirin for 5 days  prior to the procedure so I think it is reasonable to finish the cardiac  workup first before we actually schedule it here from this office.                                   Charlaine Dalton. Wert,  MD, FCCP   MBW/MedQ  DD:  09/17/2005  DT:  09/17/2005  Job #:  [id number]   cc:   Geoffry Paradise, MD  Colleen Can. Deborah Chalk, MD

## 2010-07-12 NOTE — Assessment & Plan Note (Signed)
 [facility]                               PULMONARY OFFICE NOTE   NAME:[name], Laurie KEETCH                        MRN:          [id number]  DATE:11/20/2005                            DOB:          1924/09/19    HISTORY:  A 76 year old white female with evidence of bronchiectasis on CT  scan May of 2005, with evidence of worsening infiltrates, especially in the  lingula, by most recent CT scan Jun 27, 2005, and therefore underwent  bronchoscopy on September 19 to rule out atypical TB and malignancy.  She  coughed up blood after the procedure for several days, but now has more of a  dry cough.  She denies any pleuritic pain, fevers, chills, sweats,  orthopnea, PND, leg swelling or unintended weight loss.   PHYSICAL EXAMINATION:  She is a pleasant ambulatory white female in no acute  distress.  VITAL SIGNS:  Afebrile, stable vital signs, with no change in her weight.  HEENT:  Unremarkable, oropharynx is clear.  LUNGS:  Lung fields revealed minimal rhonchi bilaterally.  Overall air  movements are adequate.  There is a regular rhythm without murmur, gallop or rub.  ABDOMEN:  Soft, benign.  EXTREMITIES:  Warm without calf tenderness, cyanosis, clubbing or edema.   Initial studies all are negative for AFB and unusual organisms.  Transbronchial biopsy and washings were also all benign.  The pattern was  consistent with bronchiectasis per Pathology's report, which I reviewed with  the patient today.   IMPRESSION:  Bronchiectasis in this patient who probably had recurrent  pneumonia as a child before antibiotics were available.  This is isolated to  the right upper lobe and lingula, and is best approached by using Mucinex DM  2 twice a day as needed for now, and having a low threshold to add broad-  spectrum antibiotics in the event of purulent sputum, cough with pleuritic  pain or fever.   I explained this to the patient as using the escalator analogy, which  she  seemed to accept and understand, and will follow up in 6 weeks with a final  chest x-ray.  After that time, I plan to see her yearly.            ______________________________  Laurie Dalton Sherene Sires, MD, [facility]    MBW/MedQ  DD:  11/20/2005  DT:  11/22/2005  Job #:  [id number]   cc:   Geoffry Paradise, M.D.

## 2010-07-12 NOTE — Assessment & Plan Note (Signed)
[facility]                             PULMONARY OFFICE NOTE   NAME:Pfefferkorn, CHASITIE PASSEY                        MRN:          [id number]  DATE:06/30/2006                            DOB:          29-Jul-1924    HISTORY:  A delightful, 77 year old white female with bilateral airspace  disease in the right upper lobe involving the right upper lobe and  lingula since 2004 s/p FOB x 2, in for followup with no symptoms.  She  denies any cough or pleuritic pain, fevers, chills, sweats, unintended  weight loss or dyspnea.  She uses Mucinex DM p.r.n., but has not needed  it lately.   For full information on medications, please see patient data sheet  column dated Jun 30, 2006, noting that she is on very high doses of beta  blockers, but tolerating them well.   PHYSICAL EXAMINATION:  She is a pleasant, ambulatory white female in no  acute distress.  Afebrile, normal vital signs with a weight of 142 pounds, which is no  change at baseline.  Blood pressure is 136/80 with a pulse rate of 60  HEENT:  Unremarkable.  Oropharynx clear.  Lungs fields are clear bilaterally to auscultation and percussion with  no localized or generalized wheezing or rhonchi.  There is a regular rate and rhythm without murmur, gallop, or rub.  No  increase in P2.  ABDOMEN:  Soft, benign.  EXTREMITIES:  Warm without calf tenderness, cyanosis, clubbing, or  edema.   Chest x-rays show no convincing changes over the last 4 years.   IMPRESSION:  Bronchiectasis involving the right upper lobe and lingula  by previous CT scan, but worrisome for both opportunistic infection and  bronchiogenic carcinoma developing in an area of scar.  The radiologist  continues to raise the issue of bronchoalveolar carcinoma, although the  pattern has been so benign, as were the biopsies/cytologies, that this  seems very unlikely.   I, therefore, recommend a followup chest x-ray today and every 6 months,  sooner  if any symptoms develop.     Charlaine Dalton. Sherene Sires, MD, [facility]  Electronically Signed    MBW/MedQ  DD: 06/30/2006  DT: 06/30/2006  Job #: [id number]   cc:   Geoffry Paradise, M.D.

## 2010-07-12 NOTE — Assessment & Plan Note (Signed)
[facility]                             PULMONARY OFFICE NOTE   NAME:Laurie [name], Laurie [name]                        MRN:          [id number]  DATE:01/29/2006                            DOB:          06/07/1924    HISTORY OF PRESENT ILLNESS:  The patient is a64 year old white female  patient of Dr. Thurston Hole who has a known history of documented  bronchiectasis involving the right upper lobe and lingula who presents  for a 62-month followup to review medications. Since last visit, the  patient reports she has been doing very well and is currently using  Mucinex DM twice daily on an as needed basis. She has had no flare in  shortness of breath, purulent sputum, fever or chest pain.  The  patient's medications are correct with her medication list today.   PAST MEDICAL HISTORY:  Reviewed.   CURRENT MEDICATIONS:  Reviewed.   PHYSICAL EXAMINATION:  The patient is pleasant female in no acute  distress. She is afebrile with stable vital signs. O2 saturation is 97%  on room air.  HEENT: Is unremarkable.  NECK: Supple without adenopathy. No JVD.  Lung sounds are clear.  CARDIAC: Regular rate and rhythm.  ABDOMEN: Is soft without any hepatosplenomegaly.  EXTREMITIES: Are warm without edema.   IMPRESSION/PLAN:  1. Bronchiectasis, currently doing well with only Mucinex DM twice      daily on an as needed basis. The patient will follow back up with      Dr. Sherene Sires in 6 months or sooner if needed.  2. Complex medication regimen. The patient's medications were reviewed      in detail. Patient education was provided. A computerized      medication calendar was completed in detail with this patient and      reviewed in detail. Patient education was provided. The patient is      aware to bring this back to each and every visit.      Rubye Oaks, NP  Electronically Signed      Charlaine Dalton. Sherene Sires, MD, [facility]  Electronically Signed   TP/MedQ  DD: 02/02/2006  DT:  02/02/2006  Job #: [id number]

## 2010-07-12 NOTE — Assessment & Plan Note (Signed)
[facility]                               PULMONARY OFFICE NOTE   NAME:Bily, TIANNAH GREENLY                        MRN:          [id number]  DATE:01/01/2006                            DOB:          1924-06-29    HISTORY:  77 year old white female with documented bronchiectasis, involving  the right upper lobe and lingula. Status post bronchoscopy in 2005, and  again in 2007, with no evidence of alveolar carcinoma, which was a concern  by radiology. In fact, the transbronchial biopsy performed in 2007, was more  consistent with bronchiectasis, with excellent sampling.   She returns today with no respiratory symptoms at all. That is, she denies  any significant cough, chest pains, fevers, chills, sweats, orthopnea PND or  leg swelling.   PHYSICAL EXAMINATION:  GENERAL: She is a robust, pleasant, ambulatory white  female in no acute distress at 140 pounds with no change at baseline.  VITAL SIGNS: Stable.  HEENT: Unremarkable, oral pharynx clear.  LUNGS: Fields perfectly clear bilaterally to auscultation and percussion  with no localized or generalized wheezing or rhonchi.  HEART: Regular rate and rhythm without murmur, gallop or rub.  ABDOMEN: Soft, benign, with no palpable organomegaly, masses, or tenderness.  EXTREMITIES: Warm, without edema. cyanosis or clubbing.   Chest x-ray shows no change in infiltrates, lingula greater than right upper  lobe.   IMPRESSION:  Bronchiectasis with scarring bilaterally and no significant  respiratory symptoms. I noticed that she is taking Mucinex DM b.i.d.,  although the specific instruction was to take 1-2 every 12 hours P.R.N. When  I asked her about this, it turns out that she maybe making multiple  medication errors, or at least inconsistencies where there is no correlation  between our extensive MAR and what she is taking. I offered to sort the  services of our nurse practitioner for the purposes of medication  reconciliation if she will agree.   Pulmonary followup needs to be done every six months, sooner if needed.    ______________________________  Charlaine Dalton Sherene Sires, MD, [facility]    MBW/MedQ  DD: 01/01/2006  DT: 01/02/2006  Job #: [id number]   cc:   Geoffry Paradise, M.D.

## 2010-07-12 NOTE — Op Note (Signed)
Laurie [name], Laurie [name]                 ACCOUNT NO.:  [id number]   MEDICAL RECORD NO.:  [id number]          PATIENT TYPE:  AMB   LOCATION:  CARD                         FACILITY:  [facility]   PHYSICIAN:  Casimiro Needle B. Sherene Sires, MD, FCCPDATE OF BIRTH:  16-Mar-1924   DATE OF PROCEDURE:  11/12/2005  DATE OF DISCHARGE:                                 OPERATIVE REPORT   PROCEDURE:  Fiberoptic bronchoscopy with transbronchial biopsy of the  lingula.   REFERRING PHYSICIAN:  Geoffry Paradise, M.D.   HISTORY:  Please see dictated pulmonary office notes in the E-chart system.  The patient agreed to the procedure after full discussion of risks, benefits  and alternatives.   The procedure was performed in the bronchoscopy suite with continuous  monitoring by surface ECG and oximetry.  The patient was premedicated with  100% lidocaine __________ nebulizer and the right nares was additionally  prepared with 2% lidocaine jelly.   Using a standard flexible fiberoptic bronchoscope, the right nares was  easily cannulated with good visualization of entire oropharynx and larynx  with no abnormality seen and normal motion of the vocal cords.  The patient  required Versed 2.5 mg IV for adequate sedation and cough suppression.   Using additional 1% lidocaine as needed, the entire tracheobronchial tree  was explored bilaterally with the following findings.  There were diffuse  mucoid to mucopurulent secretions present, especially in the right middle  lobe and lingula.   The lingula was selectively lavaged, with adequate return for cytology, AFB  and fungal stain cultures __________.   PROCEDURE:  Using a wedge position within the lingula, two adequate biopsies  were obtained with mild bleeding from the biopsies.   The patient the procedure otherwise well and a chest x-ray is pending at the  time of this dictation.   I discussed the findings with her husband and encouraged him to have her  make an appointment in 1  week to review the above studies.   IMPRESSION:  Pulmonary infiltrates with atelectasis and bronchiectasis  consistent with right middle lobe/lingular syndrome, rule out Mycobacterium  avium-intracellulare or opportunistic infection with resistant organisms.  Bronchoalveolar carcinoma, a differential suggested by Radiology, seems much  less likely based on the chronicity of her radiographic findings.           ______________________________  Charlaine Dalton Sherene Sires, MD, [facility]     MBW/MEDQ  D:  11/12/2005  T:  11/13/2005  Job:  [id number]   cc:   Geoffry Paradise, M.D.  Fax: [phone]

## 2011-03-06 ENCOUNTER — Ambulatory Visit (INDEPENDENT_AMBULATORY_CARE_PROVIDER_SITE_OTHER): Payer: Medicare HMO | Admitting: Cardiology

## 2011-03-06 ENCOUNTER — Encounter: Payer: Self-pay | Admitting: Cardiology

## 2011-03-06 DIAGNOSIS — I428 Other cardiomyopathies: Secondary | ICD-10-CM

## 2011-03-06 DIAGNOSIS — I10 Essential (primary) hypertension: Secondary | ICD-10-CM

## 2011-03-06 DIAGNOSIS — I2510 Atherosclerotic heart disease of native coronary artery without angina pectoris: Secondary | ICD-10-CM

## 2011-03-06 DIAGNOSIS — I422 Other hypertrophic cardiomyopathy: Secondary | ICD-10-CM

## 2011-03-06 DIAGNOSIS — E785 Hyperlipidemia, unspecified: Secondary | ICD-10-CM

## 2011-03-06 LAB — LIPID PANEL
HDL: 69.5 mg/dL (ref >39.00)
Triglycerides: 77 mg/dL (ref 0.0–149.0)
VLDL: 15.4 mg/dL (ref 0.0–40.0)

## 2011-03-06 LAB — LDL CHOLESTEROL, DIRECT: Direct LDL: 120.3 mg/dL

## 2011-03-06 LAB — HEPATIC FUNCTION PANEL
ALT: 11 U/L (ref 0–35)
Total Bilirubin: 0.2 mg/dL — ABNORMAL LOW (ref 0.3–1.2)
Total Protein: 6.8 g/dL (ref 6.0–8.3)

## 2011-03-06 LAB — BASIC METABOLIC PANEL
Chloride: 105 mEq/L (ref 96–112)
GFR: 65.9 mL/min (ref >60.00)
Potassium: 4.5 mEq/L (ref 3.5–5.1)
Sodium: 139 mEq/L (ref 135–145)

## 2011-03-06 NOTE — Assessment & Plan Note (Signed)
Nonobstructive on 8/10 cath but had 30-40% distal left main stenosis.  Continue ASA 81.  Low threshold for statin, will check lipids.

## 2011-03-06 NOTE — Progress Notes (Signed)
PCP: Dr. Jacky Kindle  78 yo with history of HTN, nonobstructive CAD, and possible hypertrophic cardiomyopathy variant presents for cardiology followup.  Patient was seen in the past by Dr. Deborah Chalk and is seen by me for the first time today.  She has been doing well in general.  BP has been under good control.  No syncope (had episode in 2010 that may have been due to BP meds). She is mildly short of breath after walking up a flight of steps but can walk on flat ground with no problems.  She does her housework and cooks.  No chest pain.  She occasionally gets vertigo-type dizziness that seems to be positional.    ECG: NSR, 1st degree AV block (PR 222), inferior and lateral T wave inversions, LVH  PMH: 1. HTN 2. Remote TIA 3. H/o syncope (2010): Thought to be related to BP meds. 4. Hypothyroidism 5. Hyperlipidemia 6. Bronchiectasis 7. GERD 8. CAD: Patient had LHC for chest pain in 8/10.  This showed 30-40% distal left main, myocardial bridging in the proximal LAD, and 40-50% proximal RCA.  There was, of note, prominent Thebesian vein flow.  9. Asymmetric septal hypertrophy, ? Hypertrophic CMP variant: Last echo (several years ago) showed 13 mm septal wall and 9 mm posterior wall per Dr. Ronnald Nian last note.   SH: Widow, lives alone in [place].  4 children.  Never smoked.  FH: HTN, CAD  ROS: All systems reviewed and negative except as per HPI.   Current Outpatient Prescriptions  Medication Sig Dispense Refill  . Acetaminophen (TYLENOL ARTHRITIS PAIN PO) Take by mouth as needed.        Marland Kitchen amLODipine (NORVASC) 2.5 MG tablet Take 2.5 mg by mouth daily.        Marland Kitchen aspirin 81 MG tablet Take 81 mg by mouth daily.        Marland Kitchen gemfibrozil (LOPID) 600 MG tablet Take 600 mg by mouth 2 (two) times daily before a meal.        . levothyroxine (SYNTHROID, LEVOTHROID) 75 MCG tablet Take 75 mcg by mouth daily.        Marland Kitchen losartan (COZAAR) 100 MG tablet Take 1 tablet by mouth Daily.      . metoprolol (TOPROL-XL)  100 MG 24 hr tablet Take 100 mg by mouth daily.        . Multiple Vitamin (MULTIVITAMIN) tablet Take 1 tablet by mouth daily. occasionally       . naproxen sodium (ANAPROX) 220 MG tablet Take 220 mg by mouth as needed.          BP 118/82  Pulse 56  Ht 5\' 4"  (1.626 m)  Wt 63.504 kg (140 lb)  BMI 24.03 kg/m2 General: NAD Neck: No JVD, no thyromegaly or thyroid nodule.  Lungs: Clear to auscultation bilaterally with normal respiratory effort. CV: Nondisplaced PMI.  Heart regular S1/S2, no S3, +S4, no murmur.  No peripheral edema.  No carotid bruit.  Normal pedal pulses.  Abdomen: Soft, nontender, no hepatosplenomegaly, no distention.  Neurologic: Alert and oriented x 3.  Psych: Normal affect. Extremities: No clubbing or cyanosis.

## 2011-03-06 NOTE — Assessment & Plan Note (Signed)
Patient carries a history of asymmetric septal hypertrophy.  Last echo was several years ago and is not in our system.  It is possible that HTN could be the cause of this finding (this may be most likely).  The other concern would be a variant of hypertrophic cardiomyopathy. I am going to get an echocardiogram to re-assess.

## 2011-03-06 NOTE — Assessment & Plan Note (Signed)
Well-controlled on current regimen. ?

## 2011-03-06 NOTE — Patient Instructions (Signed)
Your physician has requested that you have an echocardiogram. Echocardiography is a painless test that uses sound waves to create images of your heart. It provides your doctor with information about the size and shape of your heart and how well your heart's chambers and valves are working. This procedure takes approximately one hour. There are no restrictions for this procedure.  Your physician recommends that you have the following lab work today:  Lipids/Liver, Nutritional therapist.  Your physician wants you to follow-up in: 6 months.  You will receive a reminder letter in the mail two months in advance. If you don't receive a letter, please call our office to schedule the follow-up appointment.

## 2011-03-11 ENCOUNTER — Ambulatory Visit (HOSPITAL_COMMUNITY): Payer: Medicare HMO | Attending: Cardiology | Admitting: Radiology

## 2011-03-11 DIAGNOSIS — I428 Other cardiomyopathies: Secondary | ICD-10-CM | POA: Insufficient documentation

## 2011-03-11 DIAGNOSIS — E785 Hyperlipidemia, unspecified: Secondary | ICD-10-CM | POA: Insufficient documentation

## 2011-03-11 DIAGNOSIS — Z8673 Personal history of transient ischemic attack (TIA), and cerebral infarction without residual deficits: Secondary | ICD-10-CM | POA: Insufficient documentation

## 2011-03-11 DIAGNOSIS — I2510 Atherosclerotic heart disease of native coronary artery without angina pectoris: Secondary | ICD-10-CM | POA: Insufficient documentation

## 2011-03-11 DIAGNOSIS — I10 Essential (primary) hypertension: Secondary | ICD-10-CM | POA: Insufficient documentation

## 2011-03-13 ENCOUNTER — Other Ambulatory Visit: Payer: Self-pay | Admitting: *Deleted

## 2011-03-13 DIAGNOSIS — I2510 Atherosclerotic heart disease of native coronary artery without angina pectoris: Secondary | ICD-10-CM

## 2011-03-13 DIAGNOSIS — E785 Hyperlipidemia, unspecified: Secondary | ICD-10-CM

## 2011-03-13 MED ORDER — ATORVASTATIN CALCIUM 20 MG PO TABS: 20.0000 mg | ORAL_TABLET | Freq: Every day | ORAL | Status: DC

## 2011-03-13 NOTE — Telephone Encounter (Signed)
Notes Recorded by Jacqlyn Krauss, RN on 03/13/2011 at 8:35 AM Discussed with pt. She agreed to begin atorvastatin 20mg  daily. She will return for fasting lipid/liver profile 05/22/11. ------  Notes Recorded by Marca Ancona, MD on 03/09/2011 at 10:44 PM Given known CAD (40% distal LM), would like to see LDL < 70. Would start atorvastatin 20 mg daily. ------

## 2011-03-13 NOTE — Telephone Encounter (Signed)
Notes Recorded by Jacqlyn Krauss, RN on 03/13/2011 at 8:43 AM Discussed with pt to call in she develops symptoms of muscle aches or pain. Pt verbalized understanding.

## 2011-03-14 NOTE — Progress Notes (Signed)
Pt notified of results

## 2011-03-21 ENCOUNTER — Other Ambulatory Visit: Payer: Self-pay | Admitting: *Deleted

## 2011-03-25 ENCOUNTER — Telehealth: Payer: Self-pay | Admitting: *Deleted

## 2011-03-25 NOTE — Telephone Encounter (Signed)
I talked with pharmacist. Pt will stop gemfibrozil and start atorvastatin per Dr Shirlee Latch. . Pt is aware

## 2011-03-25 NOTE — Telephone Encounter (Signed)
[facility] called / MSG was left for need of further verification on med, return call to [phone] fax [phone].

## 2011-05-07 ENCOUNTER — Other Ambulatory Visit: Payer: Self-pay

## 2011-05-07 MED ORDER — AMLODIPINE BESYLATE 2.5 MG PO TABS: 2.5000 mg | ORAL_TABLET | Freq: Every day | ORAL | Status: DC

## 2011-05-08 ENCOUNTER — Other Ambulatory Visit: Payer: Self-pay | Admitting: *Deleted

## 2011-05-08 MED ORDER — LOSARTAN POTASSIUM 100 MG PO TABS: 100.0000 mg | ORAL_TABLET | Freq: Every day | ORAL | Status: DC

## 2011-05-09 ENCOUNTER — Other Ambulatory Visit: Payer: Self-pay

## 2011-05-09 MED ORDER — LOSARTAN POTASSIUM 100 MG PO TABS: 100.0000 mg | ORAL_TABLET | Freq: Every day | ORAL | Status: DC

## 2011-05-22 ENCOUNTER — Ambulatory Visit (INDEPENDENT_AMBULATORY_CARE_PROVIDER_SITE_OTHER): Payer: Medicare HMO | Admitting: *Deleted

## 2011-05-22 DIAGNOSIS — E785 Hyperlipidemia, unspecified: Secondary | ICD-10-CM

## 2011-05-22 DIAGNOSIS — I2510 Atherosclerotic heart disease of native coronary artery without angina pectoris: Secondary | ICD-10-CM

## 2011-05-22 LAB — LIPID PANEL
Cholesterol: 190 mg/dL (ref 0–200)
HDL: 67.8 mg/dL (ref >39.00)
Triglycerides: 89 mg/dL (ref 0.0–149.0)

## 2011-05-22 LAB — HEPATIC FUNCTION PANEL
Albumin: 3.1 g/dL — ABNORMAL LOW (ref 3.5–5.2)
Alkaline Phosphatase: 82 U/L (ref 39–117)
Total Protein: 7.1 g/dL (ref 6.0–8.3)

## 2011-05-28 ENCOUNTER — Other Ambulatory Visit: Payer: Self-pay | Admitting: *Deleted

## 2011-05-28 DIAGNOSIS — E785 Hyperlipidemia, unspecified: Secondary | ICD-10-CM

## 2011-05-29 ENCOUNTER — Other Ambulatory Visit: Payer: Self-pay | Admitting: *Deleted

## 2011-05-29 DIAGNOSIS — E785 Hyperlipidemia, unspecified: Secondary | ICD-10-CM

## 2011-05-29 DIAGNOSIS — I428 Other cardiomyopathies: Secondary | ICD-10-CM

## 2011-07-30 ENCOUNTER — Telehealth: Payer: Self-pay | Admitting: Cardiology

## 2011-07-30 ENCOUNTER — Other Ambulatory Visit: Payer: Self-pay | Admitting: *Deleted

## 2011-07-30 DIAGNOSIS — I2510 Atherosclerotic heart disease of native coronary artery without angina pectoris: Secondary | ICD-10-CM

## 2011-07-30 DIAGNOSIS — E785 Hyperlipidemia, unspecified: Secondary | ICD-10-CM

## 2011-07-30 MED ORDER — AMLODIPINE BESYLATE 2.5 MG PO TABS: 2.5000 mg | ORAL_TABLET | Freq: Every day | ORAL | Status: DC

## 2011-07-30 MED ORDER — ATORVASTATIN CALCIUM 20 MG PO TABS: 20.0000 mg | ORAL_TABLET | Freq: Every day | ORAL | Status: DC

## 2011-07-30 NOTE — Telephone Encounter (Signed)
New Problem:    Patient called in needing a refill of her amLODipine (NORVASC) 2.5 MG tablet and atorvastatin (LIPITOR) 20 MG tablet.

## 2011-09-09 ENCOUNTER — Ambulatory Visit (INDEPENDENT_AMBULATORY_CARE_PROVIDER_SITE_OTHER): Payer: Medicare HMO | Admitting: Cardiology

## 2011-09-09 ENCOUNTER — Encounter: Payer: Self-pay | Admitting: Cardiology

## 2011-09-09 VITALS — BP 147/80 | HR 64 | Ht 64.0 in | Wt 144.8 lb

## 2011-09-09 DIAGNOSIS — I10 Essential (primary) hypertension: Secondary | ICD-10-CM

## 2011-09-09 DIAGNOSIS — I2510 Atherosclerotic heart disease of native coronary artery without angina pectoris: Secondary | ICD-10-CM

## 2011-09-09 DIAGNOSIS — I422 Other hypertrophic cardiomyopathy: Secondary | ICD-10-CM

## 2011-09-09 NOTE — Assessment & Plan Note (Addendum)
Nonobstructive on 8/10 cath but had 30-40% distal left main stenosis and 40-50% proximal RCA.  Continue ASA 81.  She is on a statin, ideally would have LDL < 70.  Will repeat lipids in 9/13.

## 2011-09-09 NOTE — Assessment & Plan Note (Signed)
BP mildly elevated today.  She has had orthostatic-type symptoms with falls in the past so do not want BP too low but given hypertensive changes in the LV, would like it reasonably controlled.  She will check her BP daily x 2 wks and we will call her to see what it is running.

## 2011-09-09 NOTE — Assessment & Plan Note (Signed)
Echo in 1/13 showed mild focal basal septal hypertrophy with mild concentric LVH.  I suspect that these findings are due to hypertensive heart disease, not HCM.

## 2011-09-09 NOTE — Patient Instructions (Addendum)
Take and record your blood pressure daily about 2 hours after you take your blood pressure medication. I will call you in about 2 weeks to get the readings. Luana Shu [phone].  Your physician recommends that you return for a FASTING lipid profile /BMET. This is scheduled for September 25,2013. The lab opens at 8:30am.  Your physician wants you to follow-up in: 6 months with Dr Shirlee Latch. (January 2014). You will receive a reminder letter in the mail two months in advance. If you don't receive a letter, please call our office to schedule the follow-up appointment.

## 2011-09-09 NOTE — Progress Notes (Signed)
Patient ID: Laurie [name], female   DOB: Oct 23, 1924, 78 y.o.   MRN: [id number] PCP: Dr. Jacky Kindle  78 yo with history of HTN, nonobstructive CAD, and probable hypertensive cardiomyopathy with LVH presents for cardiology followup.  She has been doing well in general.  She is mildly short of breath after walking up a flight of steps or up a hill but can walk on flat ground with no problems.  She does her housework, Presenter, broadcasting, and cooks.  No chest pain.  She has not had any recent episodes of orthostatic dizziness or falls.  BP is mildly elevated today.   At last appointment, I had her get an echocardiogram given some concern on a prior echo for a hypertrophic cardiomyopathy variant.  This showed mild LVH with mild focal basal septal hypertrophy and no LVOT gradient or mitral valve SAM.  I think that this represents hypertensive heart disease.   ECG: NSR,  inferior and lateral T wave inversions, LVH (unchanged)  Labs (1/13): K 4.5, creatinine 0.9, LDL 104, HDL 68  PMH: 1. HTN 2. Remote TIA 3. H/o syncope (2010): Thought to be related to BP meds. 4. Hypothyroidism 5. Hyperlipidemia 6. Bronchiectasis 7. GERD 8. CAD: Patient had LHC for chest pain in 8/10.  This showed 30-40% distal left main, myocardial bridging in the proximal LAD, and 40-50% proximal RCA.  There was, of note, prominent Thebesian vein flow.  9. Hypertensive cardiomyopathy:  ? Hypertrophic CMP variant: echo several years ago showed 13 mm septal wall and 9 mm posterior wall per Dr. Ronnald Nian last note. However, echo done in 1/13 showed mild LVH with mild focal basal septal hypertrophy, no LVOT gradient, no MV SAM, grade II diastolic dysfunction, mild AI, mild MR, mild pulmonary HTN, aortic root 4.0 cm.  This most recent echo was most suggestive of hypertensive heart disease (not HCM).   SH: Widow, lives alone in [place].  4 children.  Never smoked.  FH: HTN, CAD   Current Outpatient Prescriptions  Medication Sig Dispense Refill    . Acetaminophen (TYLENOL ARTHRITIS PAIN PO) Take by mouth as needed.        Marland Kitchen amLODipine (NORVASC) 2.5 MG tablet Take 1 tablet (2.5 mg total) by mouth daily.  90 tablet  1  . aspirin 81 MG tablet Take 81 mg by mouth daily.        Marland Kitchen atorvastatin (LIPITOR) 20 MG tablet Take 1 tablet (20 mg total) by mouth daily.  90 tablet  1  . levothyroxine (SYNTHROID, LEVOTHROID) 75 MCG tablet Take 75 mcg by mouth daily.        Marland Kitchen losartan (COZAAR) 100 MG tablet Take 1 tablet (100 mg total) by mouth daily.  90 tablet  3  . metoprolol (TOPROL-XL) 100 MG 24 hr tablet Take 100 mg by mouth daily.        . Multiple Vitamin (MULTIVITAMIN) tablet Take 1 tablet by mouth daily. occasionally       . naproxen sodium (ANAPROX) 220 MG tablet Take 220 mg by mouth as needed.          BP 147/80  Pulse 64  Ht 5\' 4"  (1.626 m)  Wt 144 lb 12.8 oz (65.681 kg)  BMI 24.85 kg/m2 General: NAD Neck: No JVD, no thyromegaly or thyroid nodule.  Lungs: Clear to auscultation bilaterally with normal respiratory effort. CV: Nondisplaced PMI.  Heart regular S1/S2, no S3, +S4, no murmur.  Trace ankle edema.  No carotid bruit.  Normal pedal pulses.  Abdomen: Soft,  nontender, no hepatosplenomegaly, no distention.  Neurologic: Alert and oriented x 3.  Psych: Normal affect. Extremities: No clubbing or cyanosis.

## 2011-09-23 ENCOUNTER — Telehealth: Payer: Self-pay | Admitting: *Deleted

## 2011-09-23 NOTE — Telephone Encounter (Signed)
09/23/11 LMTCB

## 2011-09-23 NOTE — Telephone Encounter (Signed)
Hypertension - Marca Ancona, MD 09/09/2011 9:47 AM Signed  BP mildly elevated today. She has had orthostatic-type symptoms with falls in the past so do not want BP too low but given hypertensive changes in the LV, would like it reasonably controlled. She will check her BP daily x 2 wks and we will call her to see what it is running.   09/23/11 LMTCB

## 2011-09-30 NOTE — Telephone Encounter (Signed)
LMTCB --I have unable to reach pt by telephone.

## 2011-10-01 ENCOUNTER — Telehealth: Payer: Self-pay | Admitting: Cardiology

## 2011-10-01 NOTE — Telephone Encounter (Signed)
Hypertension - Marca Ancona, MD 09/09/2011 9:47 AM Signed  BP mildly elevated today. She has had orthostatic-type symptoms with falls in the past so do not want BP too low but given hypertensive changes in the LV, would like it reasonably controlled. She will check her BP daily x 2 wks and we will call her to see what it is running.   10/01/11--spoke with pt. BP readings since OV with Dr Shirlee Latch 09/09/11 137/61,133/59,138/67, 140/61,128/62. Pt denies any lightheadedness or dizziness. I will forward to Dr Shirlee Latch for review.

## 2011-10-01 NOTE — Telephone Encounter (Signed)
Pt rtn call to anne, pls call °

## 2011-10-01 NOTE — Telephone Encounter (Signed)
Spoke with pt

## 2011-10-01 NOTE — Telephone Encounter (Signed)
Readings are ok.

## 2011-11-19 ENCOUNTER — Other Ambulatory Visit (INDEPENDENT_AMBULATORY_CARE_PROVIDER_SITE_OTHER): Payer: Medicare HMO

## 2011-11-19 DIAGNOSIS — I428 Other cardiomyopathies: Secondary | ICD-10-CM

## 2011-11-19 DIAGNOSIS — I10 Essential (primary) hypertension: Secondary | ICD-10-CM

## 2011-11-19 DIAGNOSIS — E785 Hyperlipidemia, unspecified: Secondary | ICD-10-CM

## 2011-11-19 LAB — LIPID PANEL
HDL: 54.9 mg/dL (ref >39.00)
LDL Cholesterol: 105 mg/dL — ABNORMAL HIGH (ref 0–99)
VLDL: 15 mg/dL (ref 0.0–40.0)

## 2011-11-19 LAB — BASIC METABOLIC PANEL
BUN: 14 mg/dL (ref 6–23)
Calcium: 9.1 mg/dL (ref 8.4–10.5)
GFR: 56.02 mL/min — ABNORMAL LOW (ref >60.00)
Glucose, Bld: 97 mg/dL (ref 70–99)
Potassium: 3.5 mEq/L (ref 3.5–5.1)
Sodium: 142 mEq/L (ref 135–145)

## 2011-11-24 ENCOUNTER — Other Ambulatory Visit: Payer: Self-pay | Admitting: *Deleted

## 2011-11-24 DIAGNOSIS — I2510 Atherosclerotic heart disease of native coronary artery without angina pectoris: Secondary | ICD-10-CM

## 2011-11-24 MED ORDER — ATORVASTATIN CALCIUM 40 MG PO TABS: 40.0000 mg | ORAL_TABLET | Freq: Every day | ORAL | Status: DC

## 2011-12-23 ENCOUNTER — Institutional Professional Consult (permissible substitution): Payer: Medicare HMO | Admitting: Internal Medicine

## 2011-12-26 ENCOUNTER — Ambulatory Visit (INDEPENDENT_AMBULATORY_CARE_PROVIDER_SITE_OTHER): Payer: Medicare HMO | Admitting: Internal Medicine

## 2011-12-26 ENCOUNTER — Encounter: Payer: Self-pay | Admitting: Internal Medicine

## 2011-12-26 VITALS — BP 130/86 | HR 68 | Temp 97.8°F | Ht 65.0 in | Wt 149.0 lb

## 2011-12-26 DIAGNOSIS — R062 Wheezing: Secondary | ICD-10-CM

## 2011-12-26 NOTE — Progress Notes (Signed)
Subjective:    Patient ID: Laurie [name], female    DOB: Jan 05, 1925, 78 y.o.   MRN: [id number]  HPI  ARONSON,RICHARD A, MD is pcp. NP is BRittany Dabbs  Body mass index is 24.79 kg/(m^2).  reports that she has never smoked. She has never used smokeless tobacco.   IOV 12/26/2011  86 year ld  Widow since 2009. Here with daughter [name].  c/o insdious onset of wheeze x few months. Aggravated by sleeping on left side and occ when she sits. Relieved by turning to right side. Severity is mild. Never tried inhalers for same. Denies associated dyspnea anytime, hemoptysis, edema (other than baseline), pnd, sinus drainage, orthopnea.  For the wheeze she had CT scan chest (done at [facility]) and she forgot to bring disc  She has associated occ GERd per her hx (she does not remember sept 2010 Barium study favoring obstruction). She also has associaed cough for many years. Cough is mostly mild but periodically hard. QUality is  Dry cough . Will cough spontaneously and periodically cough is aggarvated by choking sensation during drinking water but happens only periodically. Cough is never for solids. Stable since onset.  RSI cough score is 20 and c/w irritable larynx/ LPR cooug And koffuman cough differntiator  Is 5 for both GERD and Neurogenic/Airway related cause of cough    LABS  Sept 2010  -<1. Partial obstruction distal esophagus of ingested 13 mm barium<BR>tablet without structurally obstructing lesion favoring probable 2. Slight induced gastroesophageal reflux. <3. Otherwise, negative.  SEpt 2010  CXR  - LUL density   CT chest Oct 2013  - patient forgot to bring disc     Dr Gretta Cool Reflux Symptom Index (> 13-15 suggestive of LPR cough) 0 -> 5  12/26/2011   Hoarseness of problem with voice 2  Clearing  Of Throat 4  Excess throat mucus or feeling of post nasal drip 1  3Difficulty swallowing food, liquid or tablets 3  0Cough after eating or lying down 0  Breathing difficulties or choking  episodes 0  Troublesome or annoying cough 3  Sensation of something sticking in throat or lump in throat 3  Heartburn, chest pain, indigestion, or stomach acid coming up 4  TOTAL 20      Kouffman Reflux v Neurogenic Cough Differentiator Reflux Comments  Do you awaken from a sound sleep coughing violently?                            With trouble breathing? n   Do you have choking episodes when you cannot  Get enough air, gasping for air ?              n   Do you usually cough when you lie down into  The bed, or when you just lie down to rest ?                          n   Do you usually cough after meals or eating?         n   Do you cough when (or after) you bend over?    n   GERD SCORE  5   Kouffman Reflux v Neurogenic Cough Differentiator Neurogenic   Do you more-or-less cough all day long? y   Does change of temperature make you cough? y   Does laughing or chuckling cause you to cough? y  Do fumes (perfume, automobile fumes, burned  Toast, etc.,) cause you to cough ?      y   Does speaking, singing, or talking on the phone cause you to cough   ?               y sometimes  Neurogenic/Airway score 5       Past Medical History  Diagnosis Date  . Hypertension   . GERD (gastroesophageal reflux disease)   . Coronary artery disease     NONOBSTRUCTIVE WITH LVH AND PROMINENT THEBESIAN VEINS, CURRENTLY WITHOUT CHEST PAIN  . SOB (shortness of breath)     WITH WALKING UP STAIRS  . Incontinence   . Back pain   . Headache   . Asymmetric septal hypertrophy   . Mitral regurgitation   . Pulmonary hypertension   . TIA (transient ischemic attack)   . Syncope and collapse     HISTORY SYNCOPE  . History of vein stripping   . Esophagitis      Family History  Problem Relation Age of Onset  . Heart disease Mother   . Prostate cancer Father   . Hypertension Father      History   Social History  . Marital Status: Married    Spouse Name: N/A    Number of Children: N/A  .  Years of Education: N/A   Occupational History  . Not on file.   Social History Main Topics  . Smoking status: Never Smoker   . Smokeless tobacco: Never Used  . Alcohol Use: No  . Drug Use: No  . Sexually Active: Not on file   Other Topics Concern  . Not on file   Social History Narrative  . No narrative on file     Allergies  Allergen Reactions  . Amoxicillin   . Aspirin     REACTION: intolerance  . Lodine (Etodolac)      Outpatient Prescriptions Prior to Visit  Medication Sig Dispense Refill  . Acetaminophen (TYLENOL ARTHRITIS PAIN PO) Take by mouth as needed.        Marland Kitchen amLODipine (NORVASC) 2.5 MG tablet Take 1 tablet (2.5 mg total) by mouth daily.  90 tablet  1  . aspirin 81 MG tablet Take 81 mg by mouth daily.        Marland Kitchen levothyroxine (SYNTHROID, LEVOTHROID) 75 MCG tablet Take 75 mcg by mouth daily.        Marland Kitchen losartan (COZAAR) 100 MG tablet Take 1 tablet (100 mg total) by mouth daily.  90 tablet  3  . metoprolol (TOPROL-XL) 100 MG 24 hr tablet Take 100 mg by mouth daily.        . Multiple Vitamin (MULTIVITAMIN) tablet Take 1 tablet by mouth daily. occasionally       . naproxen sodium (ANAPROX) 220 MG tablet Take 220 mg by mouth as needed.        Marland Kitchen atorvastatin (LIPITOR) 40 MG tablet Take 1 tablet (40 mg total) by mouth daily.  90 tablet  0     Review of Systems  Constitutional: Negative for fever and unexpected weight change.  HENT: Negative for ear pain, nosebleeds, congestion, sore throat, rhinorrhea, sneezing, trouble swallowing, dental problem, postnasal drip and sinus pressure.   Eyes: Negative for redness and itching.  Respiratory: Positive for cough, shortness of breath and wheezing. Negative for chest tightness.   Cardiovascular: Negative for palpitations and leg swelling.  Gastrointestinal: Negative for nausea and vomiting.  Genitourinary: Negative for dysuria.  Musculoskeletal: Negative for joint swelling.  Skin: Negative for rash.  Neurological: Negative  for headaches.  Hematological: Does not bruise/bleed easily.  Psychiatric/Behavioral: Negative for dysphoric mood. The patient is not nervous/anxious.        Objective:   Physical Exam  Vitals reviewed. Constitutional: She is oriented to person, place, and time. She appears well-developed and well-nourished. No distress.  HENT:  Head: Normocephalic and atraumatic.  Right Ear: External ear normal.  Left Ear: External ear normal.  Mouth/Throat: Oropharynx is clear and moist. No oropharyngeal exudate.  Eyes: Conjunctivae normal and EOM are normal. Pupils are equal, round, and reactive to light. Right eye exhibits no discharge. Left eye exhibits no discharge. No scleral icterus.  Neck: Normal range of motion. Neck supple. No JVD present. No tracheal deviation present. No thyromegaly present.  Cardiovascular: Normal rate, regular rhythm, normal heart sounds and intact distal pulses.  Exam reveals no gallop and no friction rub.   No murmur heard. Pulmonary/Chest: Effort normal and breath sounds normal. No respiratory distress. She has no wheezes. She has no rales. She exhibits no tenderness.  Abdominal: Soft. Bowel sounds are normal. She exhibits no distension and no mass. There is no tenderness. There is no rebound and no guarding.  Musculoskeletal: Normal range of motion. She exhibits no edema and no tenderness.  Lymphadenopathy:    She has no cervical adenopathy.  Neurological: She is alert and oriented to person, place, and time. She has normal reflexes. No cranial nerve deficit. She exhibits normal muscle tone. Coordination normal.  Skin: Skin is warm and dry. No rash noted. She is not diaphoretic. No erythema. No pallor.  Psychiatric: She has a normal mood and affect. Her behavior is normal. Judgment and thought content normal.          Assessment & Plan:

## 2011-12-26 NOTE — Patient Instructions (Addendum)
#  Wheezing  - pleae have full PFt breathing test  - please bring your CD rom of ct chest  #Cough   Do cough score  #Followup After pft  Bring cd rom ct chest  

## 2011-12-27 DIAGNOSIS — R062 Wheezing: Secondary | ICD-10-CM | POA: Insufficient documentation

## 2011-12-27 NOTE — Assessment & Plan Note (Signed)
#  Wheezing  - pleae have full PFt breathing test  - please bring your CD rom of ct chest  #Cough   Do cough score  #Followup After pft  Bring cd rom ct chest

## 2012-01-23 ENCOUNTER — Other Ambulatory Visit (INDEPENDENT_AMBULATORY_CARE_PROVIDER_SITE_OTHER): Payer: Medicare HMO

## 2012-01-23 DIAGNOSIS — I2510 Atherosclerotic heart disease of native coronary artery without angina pectoris: Secondary | ICD-10-CM

## 2012-01-23 LAB — LIPID PANEL
HDL: 52 mg/dL (ref >39.00)
LDL Cholesterol: 82 mg/dL (ref 0–99)
Total CHOL/HDL Ratio: 3
Triglycerides: 156 mg/dL — ABNORMAL HIGH (ref 0.0–149.0)
VLDL: 31.2 mg/dL (ref 0.0–40.0)

## 2012-01-23 LAB — HEPATIC FUNCTION PANEL
AST: 28 U/L (ref 0–37)
Albumin: 2.9 g/dL — ABNORMAL LOW (ref 3.5–5.2)
Total Bilirubin: 0.6 mg/dL (ref 0.3–1.2)

## 2012-01-27 ENCOUNTER — Other Ambulatory Visit: Payer: Medicare HMO

## 2012-02-06 ENCOUNTER — Other Ambulatory Visit (INDEPENDENT_AMBULATORY_CARE_PROVIDER_SITE_OTHER): Payer: Medicare HMO

## 2012-02-06 ENCOUNTER — Encounter: Payer: Self-pay | Admitting: Internal Medicine

## 2012-02-06 ENCOUNTER — Ambulatory Visit (INDEPENDENT_AMBULATORY_CARE_PROVIDER_SITE_OTHER): Payer: Medicare HMO | Admitting: Internal Medicine

## 2012-02-06 ENCOUNTER — Other Ambulatory Visit: Payer: Self-pay | Admitting: Internal Medicine

## 2012-02-06 VITALS — BP 130/72 | HR 60 | Temp 97.6°F | Ht 64.0 in | Wt 148.0 lb

## 2012-02-06 DIAGNOSIS — R918 Other nonspecific abnormal finding of lung field: Secondary | ICD-10-CM

## 2012-02-06 DIAGNOSIS — M129 Arthropathy, unspecified: Secondary | ICD-10-CM

## 2012-02-06 DIAGNOSIS — M1990 Unspecified osteoarthritis, unspecified site: Secondary | ICD-10-CM

## 2012-02-06 DIAGNOSIS — R062 Wheezing: Secondary | ICD-10-CM

## 2012-02-06 LAB — PULMONARY FUNCTION TEST

## 2012-02-06 LAB — SEDIMENTATION RATE: Sed Rate: 36 mm/hr — ABNORMAL HIGH (ref 0–22)

## 2012-02-06 NOTE — Assessment & Plan Note (Addendum)
Cough and wheeze are clearly due to pulmonary infiltrates. These infitlrates have been present atleast since 2005. They are bilateral, large > 5cm, nodular consolidation with irregular borderns. They are more prominent and larger over time 2005 -> 2007 -> 2013. Bronch x 2 (2005 and 2007) did not show malignant cells (non-diagnostic)  Possibilities are  - slow growing lung cancer called Bronchoalveolar lung cancer (most likely because biopsies can be false negative and appearance is consistent with this and is typically   seen in non smoking women in her age category). Most people with this form of cancer die from other causes.  - Focal BOOP (doubt it) - Acid reflux or chronic aspiration (denies)  - Sarcoidosis  (unlikely due to age and race and negative bronch bx 2005/2007 - bronch is good test for sarcoid)  -  Related to autoimmune disease like lupus, wegner or rheumatoid arthritis (doubt due to  smoldering presetation) - Infections like Mycobaceterium Avium complex (possible but prior BAL during bronch should have shown this, ? Sent back then)  PLAN  - she values quality of life a lot but within reason wants to have testing including biopsies because of various Rx options and differeing prognosis - difficult situation because repeat bronch again has possiblity of false negative results for cancer but good at isolating MAC - will get oncoimmune blod test for cancer antigens (high specific test, but poor sensitive test) - willl enquire about getting XPRESYS blood test for Non small cell lung cancer (high senstivie test so good at ruling out cancer); currently N/A in [place] - check blood tests for sarcoid, autommiune disease - bronch +/- CT guided bipsy based on above test results  Risks of pneumothorax, hemothorax, sedation/anesthesia complications such as cardiac or respiratory arrest or hypotension, stroke and bleeding all explained. Benefits of diagnosis but limitations of non-diagnosis also  explained. Patient verbalized understanding   > 50% of this > 25 min visit spent in face to face counseling (15 min visit converted to 25 min)   -

## 2012-02-06 NOTE — Patient Instructions (Addendum)
Possibilities are  - slow growing lung cancer called Bronchoalveolar lung cancer (biopsies can be "negative") and  seen in non smoking women your age - BOOP (local form) - Acid reflux or chronic aspiration  - Sarcoidosis   -  Related to autoimmune disease like lupus, wegner or rheumatoid arthritis  PLAN  - do blood test for autoimmune disease and sarcoid  - have to research about doing blood test for lung cancer antigen;  Will let you know when and if we get it

## 2012-02-06 NOTE — Progress Notes (Signed)
Subjective:    Patient ID: Laurie [name], female    DOB: [birth year], 78 y.o.   MRN: [id number]  HPI [name],Laurie A, MD is pcp. NP is BRittany Dabbs  Body mass index is 24.79 kg/(m^2).  reports that she has never smoked. She has never used smokeless tobacco.   IOV 12/26/2011  86 year ld  Widow since 2009. Here with daughter Laurie [name].  GRandaiughter is MEeredith [name] [address]  She also has associaed cough for many years. Cough is mostly mild but periodically hard. QUality is  Dry cough . Will cough spontaneously and periodically cough is aggarvated by choking sensation during drinking water but happens only periodically. Cough is never for solids. Stable since onset.  RSI cough score is 20 and c/w irritable larynx/ LPR cooug And koffuman cough differntiator  Is 5 for both GERD and Neurogenic/Airway related cause of cough   c/o insdious onset of wheeze x few months. Aggravated by sleeping on left side and occ when she sits. Relieved by turning to right side. Severity is mild. Never tried inhalers for same. Denies associated dyspnea anytime, hemoptysis, edema (other than baseline), pnd, sinus drainage, orthopnea.  For the wheeze she had CT scan chest (done at [facility]) and she forgot to bring disc     She has associated occ GERd per her hx (she does not remember sept 2010 Barium study favoring obstruction).    LABS  Sept 2010  -<1. Partial obstruction distal esophagus of ingested 13 mm barium<BR>tablet without structurally obstructing lesion favoring probable 2. Slight induced gastroesophageal reflux. <3. Otherwise, negative.  CT May 2007  - Bilateral UL nodular L > R densities and apparently progressive compared to a 2005 CT chest  SEpt 2010  CXR  - LUL density   CT chest Oct 2013  - patient forgot to bring disc   REC  #Wheezing  - pleae have full PFt breathing test  - please bring your CD rom of ct chest  #Cough  Do cough score  #Followup  After pft  Bring cd rom ct  chest   OV 02/06/2012 FU wheezing and cough after PFT and CT chest  PFT - 02/06/2012  - spiro shows obstruction with positive BD Response, normal lung vlumes and mild low diffusion  - fev1 1.12L/74%, DVC 2.5L/75%, Ratio 69, Small airways - 39%. 15% BD response infev1, TLC 3.7/80%, RV 97%, DLCO 13/76%   CT chest from [facility] Oct 11, 20123 - I see RLL superior segment and LUL posterior segment nodular consolidation bilaterally that appears fairly large with ill defined borders. There is not much significant mediastina/hilar nodes   Clearly the CT  Findings explain her symptoms and PFT. I then re-reviewed her hx. She and duaghter [name] forgot a lot of it but were able to recollect slowly. She has a CT in our system May 2007 where she has similar findings as current but much less prominent but even back then L was >r than right. The left lesion in May 2007 measured 4.5 x 3.3cm. IT appears she had had a CT in May 2005 at the then [facility] radiology and the findings in 2005 was one of even less prominence.   So overall CTs shows slow but steady grwoth since 2005 to 2013  She appears to have had bronch with TBBX on 03/03/2003 (? Site) and 10/31/2005 in [place] (? Dr Sherene Sires did both): non diagnostic bronch both times. Apparently after first one she bled a lot and is therefore fearful of bronch  Past, Family, Social reviewed: no change since last visit   Review of Systems  Constitutional: Negative for fever and unexpected weight change.  HENT: Negative for ear pain, nosebleeds, congestion, sore throat, rhinorrhea, sneezing, trouble swallowing, dental problem, postnasal drip and sinus pressure.   Eyes: Negative for redness and itching.  Respiratory: Positive for cough, shortness of breath and wheezing. Negative for chest tightness.   Cardiovascular: Negative for palpitations and leg swelling.  Gastrointestinal: Negative for nausea and vomiting.  Genitourinary: Negative for dysuria.  Musculoskeletal:  Negative for joint swelling.  Skin: Negative for rash.  Neurological: Negative for headaches.  Hematological: Does not bruise/bleed easily.  Psychiatric/Behavioral: Negative for dysphoric mood. The patient is not nervous/anxious.        Objective:   Physical Exam Vitals reviewed. Constitutional: She is oriented to person, place, and time. She appears well-developed and well-nourished. No distress.  HENT:  Head: Normocephalic and atraumatic.  Right Ear: External ear normal.  Left Ear: External ear normal.  Mouth/Throat: Oropharynx is clear and moist. No oropharyngeal exudate.  Eyes: Conjunctivae normal and EOM are normal. Pupils are equal, round, and reactive to light. Right eye exhibits no discharge. Left eye exhibits no discharge. No scleral icterus.  Neck: Normal range of motion. Neck supple. No JVD present. No tracheal deviation present. No thyromegaly present.  Cardiovascular: Normal rate, regular rhythm, normal heart sounds and intact distal pulses.  Exam reveals no gallop and no friction rub.   No murmur heard. Pulmonary/Chest: Effort normal and breath sounds normal. No respiratory distress. She has no wheezes. She has no rales. She exhibits no tenderness.  Abdominal: Soft. Bowel sounds are normal. She exhibits no distension and no mass. There is no tenderness. There is no rebound and no guarding.  Musculoskeletal: Normal range of motion. She exhibits no edema and no tenderness.  Lymphadenopathy:    She has no cervical adenopathy.  Neurological: She is alert and oriented to person, place, and time. She has normal reflexes. No cranial nerve deficit. She exhibits normal muscle tone. Coordination normal.  Skin: Skin is warm and dry. No rash noted. She is not diaphoretic. No erythema. No pallor.  Psychiatric: She has a normal mood and affect. Her behavior is normal. Judgment and thought content normal.           Assessment & Plan:

## 2012-02-06 NOTE — Progress Notes (Signed)
PFT done today. 

## 2012-02-07 LAB — ANGIOTENSIN CONVERTING ENZYME: Angiotensin-Converting Enzyme: 55 U/L — ABNORMAL HIGH (ref 8–52)

## 2012-02-07 LAB — RHEUMATOID FACTOR: Rheumatoid fact SerPl-aCnc: <10 IU/mL (ref <=14)

## 2012-02-09 ENCOUNTER — Telehealth: Payer: Self-pay | Admitting: Internal Medicine

## 2012-02-09 LAB — ANTI-SCLERODERMA ANTIBODY: Scleroderma (Scl-70) (ENA) Antibody, IgG: <1 AU/mL (ref <30)

## 2012-02-09 LAB — SJOGRENS SYNDROME-A EXTRACTABLE NUCLEAR ANTIBODY: SSA (Ro) (ENA) Antibody, IgG: 7 AU/mL (ref <30)

## 2012-02-09 LAB — CYCLIC CITRUL PEPTIDE ANTIBODY, IGG: Cyclic Citrullin Peptide Ab: <2 U/mL (ref 0.0–5.0)

## 2012-02-09 NOTE — Telephone Encounter (Signed)
Laurie [name]  Please call patient and ask if a company called [employer] about a blood test called XPRESYS for lung cancer contacted them or not. IF so, please let them know to go ahead and do it. IT is another blood test for lung cancer independent of the oncimmune she did.  Tell them that the tests she did down in the lab are so far negative. We are waiting for oncimme, turn around time 3 weeks  M

## 2012-02-17 NOTE — Telephone Encounter (Signed)
Per email pt did blood test.Jennifer Yancey Flemings, CMA

## 2012-02-20 ENCOUNTER — Telehealth: Payer: Self-pay | Admitting: Internal Medicine

## 2012-02-20 NOTE — Telephone Encounter (Signed)
onciummune test 02/06/12 - negative test. All antigens at low level.  What this means best discusssed face to fce. Give her appt in 2-3 weeks to discuss, by this time the other XPREYS antigen blood test will be back

## 2012-02-23 NOTE — Telephone Encounter (Signed)
Pt advised and appt set for 03-13-11. Carron Curie, CMA

## 2012-03-01 ENCOUNTER — Telehealth: Payer: Self-pay | Admitting: Internal Medicine

## 2012-03-01 NOTE — Telephone Encounter (Signed)
Spoke with Judeth Cornfield and gave dx code 793.19 for XPREYS blood test Nothing further needed per Judeth Cornfield

## 2012-03-03 ENCOUNTER — Telehealth: Payer: Self-pay | Admitting: Internal Medicine

## 2012-03-03 NOTE — Telephone Encounter (Signed)
Please tell them to bring the outside CT chest CD Rom woith them when they come on 03/13/11

## 2012-03-04 ENCOUNTER — Encounter: Payer: Self-pay | Admitting: Internal Medicine

## 2012-03-04 NOTE — Telephone Encounter (Signed)
Pt returned call.  I informed her of below per MR.  Pt states she brought this during her last visit with MR in Dec and MR kept it.  MR, pls advise.  Thank you.

## 2012-03-04 NOTE — Telephone Encounter (Signed)
[id number].Courtney Fenlon, CMA  

## 2012-03-04 NOTE — Telephone Encounter (Signed)
Returning call can be reached at [phone].Laurie [name]

## 2012-03-08 ENCOUNTER — Ambulatory Visit (INDEPENDENT_AMBULATORY_CARE_PROVIDER_SITE_OTHER): Payer: Medicare HMO | Admitting: Cardiology

## 2012-03-08 ENCOUNTER — Encounter: Payer: Self-pay | Admitting: Cardiology

## 2012-03-08 VITALS — BP 112/68 | HR 63 | Ht 64.0 in | Wt 150.0 lb

## 2012-03-08 DIAGNOSIS — I422 Other hypertrophic cardiomyopathy: Secondary | ICD-10-CM

## 2012-03-08 DIAGNOSIS — E785 Hyperlipidemia, unspecified: Secondary | ICD-10-CM

## 2012-03-08 DIAGNOSIS — I2510 Atherosclerotic heart disease of native coronary artery without angina pectoris: Secondary | ICD-10-CM

## 2012-03-08 DIAGNOSIS — I10 Essential (primary) hypertension: Secondary | ICD-10-CM

## 2012-03-08 NOTE — Progress Notes (Signed)
Patient ID: Laurie [name], female   DOB: Oct 07, 1924, 79 y.o.   MRN: [id number] PCP: Dr. Jacky Kindle  79 yo with history of HTN, nonobstructive CAD, and probable hypertensive cardiomyopathy with LVH presents for cardiology followup.  She has been doing well in general.  She is mildly short of breath after walking up a flight of steps or up a hill but can walk on flat ground with no problems.  She does her housework, Presenter, broadcasting, and cooks.  No chest pain.  She has not had any recent episodes of orthostatic dizziness or falls.  BP has been under good control.  Main concern recently has been pulmonary infiltrates seen on chest imaging.  She is being seen by Dr. Marchelle Gearing with workup ongoing.  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   ECG: NSR, mild 1st degree AV block, lateral TWIs  Labs (1/13): K 4.5, creatinine 0.9, LDL 104, HDL 68 Labs (9/13): K 3.5, creatinine 1.0 Labs (11/13): LDL 82, HDL 52  PMH: 1. HTN 2. Remote TIA 3. H/o syncope (2010): Thought to be related to BP meds. 4. Hypothyroidism 5. Hyperlipidemia 6. Bronchiectasis 7. GERD 8. CAD: Patient had LHC for chest pain in 8/10.  This showed 30-40% distal left main, myocardial bridging in the proximal LAD, and 40-50% proximal RCA.  There was, of note, prominent Thebesian vein flow.  9. Hypertensive cardiomyopathy:  ? Hypertrophic CMP variant: echo several years ago showed 13 mm septal wall and 9 mm posterior wall per Dr. Ronnald Nian last note. However, echo done in 1/13 showed mild LVH with mild focal basal septal hypertrophy, no LVOT gradient, no MV SAM, grade II diastolic dysfunction, mild AI, mild MR, mild pulmonary HTN, aortic root 4.0 cm.  This most recent echo was most suggestive of hypertensive heart disease (not HCM).  10. Pulmonary infiltrates: ? Bronchoalveolar carcinoma.  PFTs (11/13) with mild restriction, no obstruction.   SH: Widow, lives alone in [place].  4 children.  Never smoked.  FH: HTN, CAD   Current Outpatient  Prescriptions  Medication Sig Dispense Refill  . Acetaminophen (TYLENOL ARTHRITIS PAIN PO) Take by mouth as needed.        Marland Kitchen amLODipine (NORVASC) 2.5 MG tablet Take 1 tablet (2.5 mg total) by mouth daily.  90 tablet  1  . aspirin 81 MG tablet Take 81 mg by mouth daily.        Marland Kitchen atorvastatin (LIPITOR) 40 MG tablet Take 20 mg by mouth daily.      Marland Kitchen levothyroxine (SYNTHROID, LEVOTHROID) 75 MCG tablet Take 75 mcg by mouth daily.        Marland Kitchen losartan (COZAAR) 100 MG tablet Take 1 tablet (100 mg total) by mouth daily.  90 tablet  3  . metoprolol (TOPROL-XL) 100 MG 24 hr tablet Take 100 mg by mouth daily.        . Multiple Vitamin (MULTIVITAMIN) tablet Take 1 tablet by mouth daily. occasionally       . naproxen sodium (ANAPROX) 220 MG tablet Take 220 mg by mouth as needed.          BP 112/68  Pulse 63  Ht 5\' 4"  (1.626 m)  Wt 150 lb (68.04 kg)  BMI 25.75 kg/m2 General: NAD Neck: No JVD, no thyromegaly or thyroid nodule.  Lungs: Slight crackles left base. CV: Nondisplaced PMI.  Heart regular S1/S2, no S3, +S4, 1/6 SEM.  No edema.  No carotid bruit.  Normal pedal pulses.  Abdomen: Soft, nontender, no hepatosplenomegaly, no distention.  Neurologic: Alert  and oriented x 3.  Psych: Normal affect. Extremities: No clubbing or cyanosis.   Assessment/Plan:  Asymmetric septal hypertrophy Echo in 1/13 showed mild focal basal septal hypertrophy with mild concentric LVH. I suspect that these findings are due to hypertensive heart disease, not HCM.  Coronary artery disease Nonobstructive on 8/10 cath but had 30-40% distal left main stenosis and 40-50% proximal RCA. Continue ASA 81. She is on atorvastatin, lipids were acceptable in 11/13.  Hypertension  BP has been well-controlled.  No orthostatic symptoms.   Marca Ancona 03/08/2012 1:58 PM

## 2012-03-08 NOTE — Telephone Encounter (Signed)
I called PCP and CT was done at [facility]. I will call and get a new CD rom. Carron Curie, CMA

## 2012-03-08 NOTE — Telephone Encounter (Signed)
Spoke to grandaughter [name] on 03/05/12  - her aunt patient dpoa [name] home phone [phone] is inactive. Use only cell [phone]. Please update epic   - CT is not wit them. So, please call pcp ARONSON,RICHARD A, MD office and find out where the CT was done. Then we call that office and get CD rom done. I or patient can go pick up. Please do this ASAP  Thanks  MR

## 2012-03-08 NOTE — Patient Instructions (Addendum)
Your physician wants you to follow-up in: 1 year with Dr McLean. (January 2015).  You will receive a reminder letter in the mail two months in advance. If you don't receive a letter, please call our office to schedule the follow-up appointment.  

## 2012-03-11 NOTE — Telephone Encounter (Signed)
I called [facility] and requested CD they will have it ready in AM but pt needs to pick-up. I called and spoke with Eunice Blase and she will gt CD before appt tomorrow.Carron Curie, CMA

## 2012-03-12 ENCOUNTER — Ambulatory Visit (INDEPENDENT_AMBULATORY_CARE_PROVIDER_SITE_OTHER): Payer: Medicare HMO | Admitting: Internal Medicine

## 2012-03-12 VITALS — BP 120/70 | HR 50 | Temp 96.7°F | Ht 64.0 in | Wt 150.8 lb

## 2012-03-12 DIAGNOSIS — R918 Other nonspecific abnormal finding of lung field: Secondary | ICD-10-CM

## 2012-03-12 NOTE — Patient Instructions (Addendum)
Possibilities are  - slow growing lung cancer called Bronchoalveolar lung cancer (biopsies can be "negative") and  seen in non smoking women your age - BOOP (local form) - Acid reflux or chronic aspiration  - Sarcoidosis - now unlikely  -  Related to autoimmune disease like lupus, wegner or rheumatoid arthritis - ruled out  PLAN - Please try BREO 1 puff daily for wheeze  -will discuss at tumor board Mar 18, 2012 and get back to you   - decision is bronchoscopy approach   - risks are not being able to make diagnosis similar to last time, bleeding (low risk), sedation risk (low), lung collapse (low)  - benefit is simple procedure and chance t make diagnosis - or do surgical lung biopsy  - risks are longer hospital stay, and some pain. Surgeon has to determine risk for complications but I think is low  - benefit is definitive diagnosis  - or wathc with our without prednisone  - side effects of prednisone discussed

## 2012-03-12 NOTE — Progress Notes (Signed)
Subjective:    Patient ID: Laurie [name], female    DOB: [birth year], 79 y.o.   MRN: [id number]  HPI ARONSON,RICHARD A, MD is pcp. NP is BRittany Dabbs  Body mass index is 24.79 kg/(m^2).  reports that she has never smoked. She has never used smokeless tobacco.   IOV 12/26/2011  79 y.o.  Widow since 2009. Here with daughter Laurie [name].  GRandaiughter is [name] 2100  She also has associaed cough for many years. Cough is mostly mild but periodically hard. QUality is  Dry cough . Will cough spontaneously and periodically cough is aggarvated by choking sensation during drinking water but happens only periodically. Cough is never for solids. Stable since onset.  RSI cough score is 20 and c/w irritable larynx/ LPR cooug And koffuman cough differntiator  Is 5 for both GERD and Neurogenic/Airway related cause of cough   c/o insdious onset of wheeze x few months. Aggravated by sleeping on left side and occ when she sits. Relieved by turning to right side. Severity is mild. Never tried inhalers for same. Denies associated dyspnea anytime, hemoptysis, edema (other than baseline), pnd, sinus drainage, orthopnea.  For the wheeze she had CT scan chest ([facility] at [facility]) and she forgot to bring disc     She has associated occ GERd per her hx (she does not remember sept 2010 Barium study favoring obstruction).    LABS  Sept 2010  -<1. Partial obstruction distal esophagus of ingested 13 mm barium<BR>tablet without structurally obstructing lesion favoring probable 2. Slight induced gastroesophageal reflux. <3. Otherwise, negative.  CT May 2007  - Bilateral UL nodular L > R densities and apparently progressive compared to a 2005 CT chest  SEpt 2010  CXR  - LUL density   CT chest Oct 2013  - patient forgot to bring disc   REC  #Wheezing  - pleae have full PFt breathing test  - please bring your CD rom of ct chest  #Cough  Do cough score  #Followup  After pft  Bring cd rom ct  chest   OV 02/06/2012 FU wheezing and cough after PFT and CT chest  PFT - 02/06/2012  - spiro shows obstruction with positive BD Response, normal lung vlumes and mild low diffusion  - fev1 1.12L/74%, DVC 2.5L/75%, Ratio 69, Small airways - 39%. 15% BD response infev1, TLC 3.7/80%, RV 97%, DLCO 13/76%   CT chest from [facility]/ [facility]: Oct 11, 20123 - I see RLL superior segment and LUL posterior segment nodular consolidation bilaterally that appears fairly large with ill defined borders. There is not much significant mediastina/hilar nodes   Clearly the CT  Findings explain her symptoms and PFT. I then re-reviewed her hx. She and duaghter [name] forgot a lot of it but were able to recollect slowly. She has a CT in our system May 2007 where she has similar findings as current but much less prominent but even back then L was >r than right. The left lesion in May 2007 measured 4.5 x 3.3cm. IT appears she had had a CT in May 2005 at the then [facility] radiology and the findings in 2005 was one of even less prominence.   So overall CTs shows slow but steady grwoth since 2005 to 2013  She appears to have had bronch with TBBX on 03/03/2003 (? Site) and 10/31/2005 in lingular (? Dr Sherene Sires did both): non diagnostic bronch both times. Apparently after first one she bled a lot and is therefore fearful  of bronch   Past, Family, Social reviewed: no change since last visit    Possibilities are  - slow growing lung cancer called Bronchoalveolar lung cancer (biopsies can be "negative") and seen in non smoking women your age  - BOOP (local form)  - Acid reflux or chronic aspiration  - Sarcoidosis  - Related to autoimmune disease like lupus, wegner or rheumatoid arthritis  PLAN  - do blood test for autoimmune disease and sarcoid  - have to research about doing blood test for lung cancer antigen; Will let you know when and if we get it     OV 03/12/2012   Here to discuss results.    - oncimmune specific  antigen panel for lung cancer - negative - XPRESYS - lung cancer antigen test with negative predictive value - indeterminate - ACE borderline highg  - Autoimmune negative  - She presents with daugther. Cough still bothers her. Lack of etiology frustrating them.  No new issues   Past Medical History  Diagnosis Date  . Hypertension   . GERD (gastroesophageal reflux disease)   . Coronary artery disease     NONOBSTRUCTIVE WITH LVH AND PROMINENT THEBESIAN VEINS, CURRENTLY WITHOUT CHEST PAIN  . SOB (shortness of breath)     WITH WALKING UP STAIRS  . Incontinence   . Back pain   . Headache   . Asymmetric septal hypertrophy   . Mitral regurgitation   . Pulmonary hypertension   . TIA (transient ischemic attack)   . Syncope and collapse     HISTORY SYNCOPE  . History of vein stripping   . Esophagitis   . Other nonspecific abnormal finding of lung field      Family History  Problem Relation Age of Onset  . Heart disease Mother   . Prostate cancer Father   . Hypertension Father      History   Social History  . Marital Status: Married    Spouse Name: N/A    Number of Children: N/A  . Years of Education: N/A   Occupational History  . Not on file.   Social History Main Topics  . Smoking status: Never Smoker   . Smokeless tobacco: Never Used  . Alcohol Use: No  . Drug Use: No  . Sexually Active: Not on file   Other Topics Concern  . Not on file   Social History Narrative  . No narrative on file     Allergies  Allergen Reactions  . Amoxicillin   . Aspirin     REACTION: intolerance  . Lodine (Etodolac)      Outpatient Prescriptions Prior to Visit  Medication Sig Dispense Refill  . Acetaminophen (TYLENOL ARTHRITIS PAIN PO) Take by mouth as needed.        Marland Kitchen amLODipine (NORVASC) 2.5 MG tablet Take 1 tablet (2.5 mg total) by mouth daily.  90 tablet  1  . aspirin 81 MG tablet Take 81 mg by mouth daily.        Marland Kitchen atorvastatin (LIPITOR) 40 MG tablet Take 20 mg by  mouth daily.      Marland Kitchen levothyroxine (SYNTHROID, LEVOTHROID) 75 MCG tablet Take 75 mcg by mouth daily.        Marland Kitchen losartan (COZAAR) 100 MG tablet Take 1 tablet (100 mg total) by mouth daily.  90 tablet  3  . metoprolol (TOPROL-XL) 100 MG 24 hr tablet Take 100 mg by mouth daily.        . Multiple Vitamin (MULTIVITAMIN) tablet  Take 1 tablet by mouth daily. occasionally       . naproxen sodium (ANAPROX) 220 MG tablet Take 220 mg by mouth as needed.         Last reviewed on 03/12/2012  2:49 PM by Darrell Jewel, CMA   Review of Systems  Constitutional: Negative for fever and unexpected weight change.  HENT: Negative for ear pain, nosebleeds, congestion, sore throat, rhinorrhea, sneezing, trouble swallowing, dental problem, postnasal drip and sinus pressure.   Eyes: Negative for redness and itching.  Respiratory: Positive for cough, shortness of breath and wheezing. Negative for chest tightness.   Cardiovascular: Negative for palpitations and leg swelling.  Gastrointestinal: Negative for nausea and vomiting.  Genitourinary: Negative for dysuria.  Musculoskeletal: Negative for joint swelling.  Skin: Negative for rash.  Neurological: Negative for headaches.  Hematological: Does not bruise/bleed easily.  Psychiatric/Behavioral: Negative for dysphoric mood. The patient is not nervous/anxious.        Objective:   Physical Exam Vitals reviewed. Constitutional: She is oriented to person, place, and time. She appears well-developed and well-nourished. No distress.  Neurological: She is alert and oriented to person, place, and time. She has normal reflexes. No cranial nerve deficit. She exhibits normal muscle tone. Coordination normal.  Skin: Skin is warm and dry. No rash noted. She is not diaphoretic. No erythema. No pallor.  Psychiatric: She has a normal mood and affect. Her behavior is normal. Judgment and thought content normal.    Discussion only visit        Assessment & Plan:

## 2012-03-14 NOTE — Assessment & Plan Note (Addendum)
Possibilities are  - slow growing lung cancer called Bronchoalveolar lung cancer (biopsies can be "negative") and  seen in non smoking women your age - BOOP (local form) - Acid reflux or chronic aspiration  - Sarcoidosis - now unlikely  -  Related to autoimmune disease like lupus, wegner or rheumatoid arthritis - ruled out  PLAN - Please try BREO 1 puff daily for wheeze  -will discuss at tumor board Mar 18, 2012 and get back to you   - decision is bronchoscopy approach   - risks are not being able to make diagnosis similar to last time ( 2 x prior non diagnostic bronchs make the chance of another non diagnostic bx high), bleeding (low risk), sedation risk (low), lung collapse (low)  - benefit is simple procedure and chance t make diagnosis - or do surgical lung biopsy  - risks are longer hospital stay, and some pain. Surgeon has to determine risk for complications but I think is low  - benefit is definitive diagnosis  - or wathc with our without prednisone  - side effects of prednisone discussed   (> 50% of this 15 min visit spent in face to face counseling)

## 2012-03-18 ENCOUNTER — Telehealth: Payer: Self-pay | Admitting: Internal Medicine

## 2012-03-18 DIAGNOSIS — R911 Solitary pulmonary nodule: Secondary | ICD-10-CM

## 2012-03-18 DIAGNOSIS — R918 Other nonspecific abnormal finding of lung field: Secondary | ICD-10-CM

## 2012-03-18 NOTE — Telephone Encounter (Signed)
I spoke with the pt daughter and she states that MR called her and LM and advised to have him pages so he can call her and discuss what was found at conference. I paged MR and advised of contact number. Carron Curie, CMA

## 2012-03-18 NOTE — Telephone Encounter (Signed)
PET set for 03-26-12. Laurie [name], CMA

## 2012-03-18 NOTE — Telephone Encounter (Signed)
Findings of multidisciplinary thoracic oncology tumor Board meeting discussed with Laurie [name] and son Laurie [name] over phone  - Please arrange PET scan - wed/fri preferred. Do  < 10 days - 14 days. If further delay let m eknow. 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 Kalman Shan, M.D., [facility].C.P Pulmonary and Critical Care Medicine Staff Physician [facility] [place] [facility] Pager: [phone], If no answer or between  15:00h - 7:00h: call [phone]  03/18/2012 12:07 PM

## 2012-03-26 ENCOUNTER — Encounter: Payer: Self-pay | Admitting: Internal Medicine

## 2012-03-26 ENCOUNTER — Encounter (HOSPITAL_COMMUNITY): Payer: Self-pay

## 2012-03-26 ENCOUNTER — Encounter (HOSPITAL_COMMUNITY)
Admission: RE | Admit: 2012-03-26 | Discharge: 2012-03-26 | Disposition: A | Discharge status: Home / Self Care | Payer: Medicare HMO | Source: Ambulatory Visit | Attending: Internal Medicine | Admitting: Internal Medicine

## 2012-03-26 DIAGNOSIS — R222 Localized swelling, mass and lump, trunk: Secondary | ICD-10-CM | POA: Insufficient documentation

## 2012-03-26 DIAGNOSIS — R918 Other nonspecific abnormal finding of lung field: Secondary | ICD-10-CM

## 2012-03-26 DIAGNOSIS — R911 Solitary pulmonary nodule: Secondary | ICD-10-CM | POA: Insufficient documentation

## 2012-03-26 LAB — GLUCOSE, CAPILLARY: Glucose-Capillary: 91 mg/dL (ref 70–99)

## 2012-03-26 MED ORDER — FLUDEOXYGLUCOSE F - 18 (FDG) INJECTION
17.2000 | Freq: Once | INTRAVENOUS | Status: AC | PRN
  Administered 2012-03-26: 17.2 via INTRAVENOUS

## 2012-03-29 ENCOUNTER — Telehealth: Payer: Self-pay | Admitting: Internal Medicine

## 2012-03-29 DIAGNOSIS — K219 Gastro-esophageal reflux disease without esophagitis: Secondary | ICD-10-CM

## 2012-03-29 DIAGNOSIS — K2289 Other specified disease of esophagus: Secondary | ICD-10-CM

## 2012-03-29 DIAGNOSIS — R918 Other nonspecific abnormal finding of lung field: Secondary | ICD-10-CM

## 2012-03-29 DIAGNOSIS — K228 Other specified diseases of esophagus: Secondary | ICD-10-CM

## 2012-03-29 NOTE — Telephone Encounter (Signed)
I spoke with Eunice Blase and advised. Carron Curie, CMA

## 2012-03-29 NOTE — Telephone Encounter (Signed)
Please tell daughter  [name] (cell phonoe) or by calling [facility] [phone] that lesions are HOT on pet scan and patient definitely needs biopsy. Most likely bronch through a new superior method that will allow [place] to biopsy both sides with better yield.  I am in discussion with doctors byrum who does that. Will call in 1-2 days to give more update and schedule.

## 2012-03-31 NOTE — Telephone Encounter (Signed)
Orders have been placed and sent to [facility].Carron Curie, CMA

## 2012-03-31 NOTE — Telephone Encounter (Signed)
Discussed with [name] and rest of family on sepaker phone  1)  - needs ENB. Discussed super D CT need and how procedure works and done by Dr Delton Coombes. Risks of pneumothorax, hemothorax, sedation/anesthesia complications such as cardiac or respiratory arrest or hypotension, stroke and bleeding all explained. Benefits of diagnosis but limitations of non-diagnosis also explained. Family verbalized understanding and wished to proceed.    - So, please set up SUPER D CT for a Friday. Dr Santina Evans to do procedure  2) PET hot lesion in GE junction. Please refer to [facility]. Needs appt < 10 days if possible. IF not, let me know asap  Thanks  MR  Cc to [facility], Lorenda Ishihara, Levy Pupa

## 2012-04-01 ENCOUNTER — Telehealth: Payer: Self-pay | Admitting: Internal Medicine

## 2012-04-01 NOTE — Addendum Note (Signed)
Addended by: Leslye Peer on: 04/01/2012 01:09 PM   Modules accepted: Orders

## 2012-04-01 NOTE — Telephone Encounter (Signed)
Pulmonary requesting pt be seen for abnormal area on pet scan. Requesting pt be seen within 10days. Pt scheduled to see Mike Gip PA 04/05/12@1 :30pmAlmyra Free to notify pt of appt date and time.

## 2012-04-02 ENCOUNTER — Encounter (HOSPITAL_COMMUNITY): Payer: Self-pay | Admitting: Pharmacy Technician

## 2012-04-02 ENCOUNTER — Ambulatory Visit (INDEPENDENT_AMBULATORY_CARE_PROVIDER_SITE_OTHER)
Admission: RE | Admit: 2012-04-02 | Discharge: 2012-04-02 | Disposition: A | Discharge status: Home / Self Care | Payer: Medicare HMO | Source: Ambulatory Visit | Attending: Internal Medicine | Admitting: Internal Medicine

## 2012-04-02 DIAGNOSIS — K219 Gastro-esophageal reflux disease without esophagitis: Secondary | ICD-10-CM

## 2012-04-02 DIAGNOSIS — K228 Other specified diseases of esophagus: Secondary | ICD-10-CM

## 2012-04-02 DIAGNOSIS — R918 Other nonspecific abnormal finding of lung field: Secondary | ICD-10-CM

## 2012-04-02 DIAGNOSIS — R222 Localized swelling, mass and lump, trunk: Secondary | ICD-10-CM

## 2012-04-02 DIAGNOSIS — K229 Disease of esophagus, unspecified: Secondary | ICD-10-CM

## 2012-04-05 ENCOUNTER — Ambulatory Visit: Payer: Medicare HMO | Admitting: Physician Assistant

## 2012-04-06 ENCOUNTER — Encounter: Payer: Self-pay | Admitting: *Deleted

## 2012-04-06 ENCOUNTER — Ambulatory Visit (INDEPENDENT_AMBULATORY_CARE_PROVIDER_SITE_OTHER): Payer: Medicare HMO | Admitting: Physician Assistant

## 2012-04-06 VITALS — BP 118/70 | HR 62 | Ht 64.0 in | Wt 148.0 lb

## 2012-04-06 DIAGNOSIS — R9389 Abnormal findings on diagnostic imaging of other specified body structures: Secondary | ICD-10-CM

## 2012-04-06 DIAGNOSIS — K219 Gastro-esophageal reflux disease without esophagitis: Secondary | ICD-10-CM

## 2012-04-06 NOTE — Progress Notes (Signed)
Subjective:    Patient ID: Laurie [name], female    DOB: 1924-08-24, 79 y.o.   MRN: [id number]  HPI Laurie [name] is a pleasant 79 year old white female known remotely to Dr. Marina Goodell from prior colonoscopy which was done in 2006. She had left-sided diverticulosis and one diminutive colon polyp in the descending colon which was removed. Path on this polyp was a tubular adenoma. She is currently referred because of an abnormal PET scan. She has been undergoing pulmonary evaluation with Dr. Marchelle Gearing, because of new onset of wheeze over the past few months. Or wheezing seems to be positional and relieved by lying on her right side. She has undergone CT scan of the chest in October of 2013 which showed nodular consolidation bilaterally. She had subsequent PET scan on 03/26/2012 which showed an intense hyper metabolic 2.5 cm nodule in the right lower lobe embedded within the dense hilar consolidation and concerning for an underlying bronchogenic carcinoma. Also bilateral perihilar consolidation with moderate metabolic activity and multiple bilateral pulmonary nodules with mild metabolic activity concerning for metastatic disease versus inflammatory nodules. There was also focal increased activity at the level of the GE junction as well as in the gastric antrum and pyloric region. Endoscopy was suggested. She really has little in the way of GI symptoms at this time. She says she does get reflux if she eat a lot but has not having any regular heartburn or indigestion her appetite has been good her weight has been stable. She says she has been having some discomfort on both of her "sides" off and on over the past few months and has ongoing problems with constipation for which she uses MiraLax when necessary. She has occasional cough which has not been persistent and other than that her only other symptom has been no wheezing.. She is scheduled to undergo bronchoscopy on February 17.    Review of Systems  Constitutional:  Negative.   HENT: Negative.   Eyes: Negative.   Respiratory: Positive for cough and wheezing.   Cardiovascular: Negative.   Gastrointestinal: Positive for abdominal pain and constipation.  Endocrine: Negative.   Genitourinary: Negative.   Allergic/Immunologic: Negative.   Neurological: Negative.   Hematological: Negative.   Psychiatric/Behavioral: Negative.    Outpatient Prescriptions Prior to Visit  Medication Sig Dispense Refill  . Acetaminophen (TYLENOL ARTHRITIS PAIN PO) Take 1 tablet by mouth daily as needed. For pain      . amLODipine (NORVASC) 2.5 MG tablet Take 2.5 mg by mouth daily.      Marland Kitchen aspirin 81 MG tablet Take 81 mg by mouth daily.        Marland Kitchen atorvastatin (LIPITOR) 40 MG tablet Take 20 mg by mouth daily.      Marland Kitchen levothyroxine (SYNTHROID, LEVOTHROID) 75 MCG tablet Take 75 mcg by mouth daily.        Marland Kitchen losartan (COZAAR) 100 MG tablet Take 1 tablet (100 mg total) by mouth daily.  90 tablet  3  . metoprolol (TOPROL-XL) 100 MG 24 hr tablet Take 100 mg by mouth daily.        . Multiple Vitamin (MULTIVITAMIN) tablet Take 1 tablet by mouth daily. occasionally       . naproxen sodium (ANAPROX) 220 MG tablet Take 220 mg by mouth daily as needed. For pain       No facility-administered medications prior to visit.   Allergies  Allergen Reactions  . Amoxicillin   . Lodine (Etodolac)    Patient Active Problem List  Diagnosis  .  HYPOTHYROIDISM  . HYPERLIPIDEMIA  . BRONCHIECTASIS  . Hypertension  . GERD (gastroesophageal reflux disease)  . Coronary artery disease  . SOB (shortness of breath)  . Incontinence  . Back pain  . Headache  . Asymmetric septal hypertrophy  . Mitral regurgitation  . Pulmonary hypertension  . TIA (transient ischemic attack)  . Syncope and collapse  . History of vein stripping  . Esophagitis  . Wheeze  . Pulmonary infiltrates   History  Substance Use Topics  . Smoking status: Never Smoker   . Smokeless tobacco: Never Used  . Alcohol Use: No       family history includes Heart disease in her mother; Hypertension in her father; and Prostate cancer in her father.  Objective:   Physical Exam  well-developed elderly white female in no acute distress, accompanied by her daughter -blood pressure 118/70 pulse 62 height 5 foot 4 weight 148. HEENT; nontraumatic normocephalic EOMI PERRLA sclera anicteric,Neck; Supple no JVD, Cardiovascular; regular rate and rhythm with S1-S2 no murmur or gallop, Pulmonary; clear no current wheezing, Abdomen; soft nontender nondistended bowel sounds are active there is no palpable mass or hepatosplenomegaly, Rectal ;exam not done, Extremities; no clubbing, cyanosis, or edema, skin warm and dry, Psych; mood and affect normal and appropriate.        Assessment & Plan:  #34 79 year old female with abnormal PET scan suggesting increased activity at the GE junction as well as in the gastric antrum and pyloric region of unclear etiology. Rule out inflammatory/gastropathy versus neoplasm. #2 probable underlying bronchogenic carcinoma-bronchoscopy to be done on Monday, 04/12/2012 #3 bronchiectasis #4 coronary artery disease #5 hypertension #6 pulmonary hypertension  Plan; Will schedule for upper endoscopy with Dr. Marina Goodell. Procedure discussed in detail with the patient and her daughter and they are agreeable to proceed. We will schedule this towards the end of next week after she has completed  her bronchoscopy. Further recommendations pending findings at EGD

## 2012-04-06 NOTE — Progress Notes (Signed)
Agree with assessment and plans. Etiology of abnormal PET scan in the GI area unclear. We'll clarify with upper endoscopy.

## 2012-04-06 NOTE — Patient Instructions (Addendum)
You have been scheduled for an endoscopy with propofol. Please follow written instructions given to you at your visit today. If you use inhalers (even only as needed) or a CPAP machine, please bring them with you on the day of your procedure. 

## 2012-04-07 ENCOUNTER — Other Ambulatory Visit: Payer: Self-pay | Admitting: *Deleted

## 2012-04-08 ENCOUNTER — Inpatient Hospital Stay (HOSPITAL_COMMUNITY): Admission: RE | Admit: 2012-04-08 | Payer: Medicare HMO | Source: Ambulatory Visit

## 2012-04-08 ENCOUNTER — Encounter (HOSPITAL_COMMUNITY): Payer: Self-pay | Admitting: *Deleted

## 2012-04-08 NOTE — Progress Notes (Addendum)
Dr.McLean is cardiologist with visit in epic from 02/2012  Echo report in epic from 03/11/11 Stress test done about 15+yrs ago Heart cath in epic from 2010  Medical Md is Dr.Richard Jacky Kindle  EKG in epic from 03/08/12  CXR DOS

## 2012-04-12 ENCOUNTER — Observation Stay (HOSPITAL_COMMUNITY)
Admission: RE | Admit: 2012-04-12 | Discharge: 2012-04-13 | Disposition: A | Discharge status: Home / Self Care | Payer: Medicare HMO | Source: Ambulatory Visit | Attending: Emergency Medicine | Admitting: Emergency Medicine

## 2012-04-12 ENCOUNTER — Ambulatory Visit (HOSPITAL_COMMUNITY): Payer: Medicare HMO

## 2012-04-12 ENCOUNTER — Encounter (HOSPITAL_COMMUNITY)
Admission: RE | Disposition: A | Discharge status: Home / Self Care | Payer: Self-pay | Source: Ambulatory Visit | Attending: Emergency Medicine

## 2012-04-12 ENCOUNTER — Encounter (HOSPITAL_COMMUNITY): Payer: Self-pay | Admitting: Emergency Medicine

## 2012-04-12 ENCOUNTER — Encounter: Payer: Self-pay | Admitting: Internal Medicine

## 2012-04-12 ENCOUNTER — Ambulatory Visit (HOSPITAL_COMMUNITY): Payer: Medicare HMO | Admitting: Anesthesiology

## 2012-04-12 ENCOUNTER — Encounter (HOSPITAL_COMMUNITY): Payer: Self-pay | Admitting: Anesthesiology

## 2012-04-12 ENCOUNTER — Other Ambulatory Visit: Payer: Self-pay | Admitting: *Deleted

## 2012-04-12 DIAGNOSIS — I10 Essential (primary) hypertension: Secondary | ICD-10-CM

## 2012-04-12 DIAGNOSIS — Z9889 Other specified postprocedural states: Secondary | ICD-10-CM

## 2012-04-12 DIAGNOSIS — J479 Bronchiectasis, uncomplicated: Secondary | ICD-10-CM

## 2012-04-12 DIAGNOSIS — K219 Gastro-esophageal reflux disease without esophagitis: Secondary | ICD-10-CM

## 2012-04-12 DIAGNOSIS — I272 Pulmonary hypertension, unspecified: Secondary | ICD-10-CM

## 2012-04-12 DIAGNOSIS — E039 Hypothyroidism, unspecified: Secondary | ICD-10-CM

## 2012-04-12 DIAGNOSIS — I517 Cardiomegaly: Secondary | ICD-10-CM

## 2012-04-12 DIAGNOSIS — R55 Syncope and collapse: Secondary | ICD-10-CM

## 2012-04-12 DIAGNOSIS — R51 Headache: Secondary | ICD-10-CM

## 2012-04-12 DIAGNOSIS — G459 Transient cerebral ischemic attack, unspecified: Secondary | ICD-10-CM

## 2012-04-12 DIAGNOSIS — R062 Wheezing: Secondary | ICD-10-CM

## 2012-04-12 DIAGNOSIS — I340 Nonrheumatic mitral (valve) insufficiency: Secondary | ICD-10-CM

## 2012-04-12 DIAGNOSIS — E785 Hyperlipidemia, unspecified: Secondary | ICD-10-CM

## 2012-04-12 DIAGNOSIS — R042 Hemoptysis: Secondary | ICD-10-CM

## 2012-04-12 DIAGNOSIS — R0602 Shortness of breath: Secondary | ICD-10-CM

## 2012-04-12 DIAGNOSIS — R32 Unspecified urinary incontinence: Secondary | ICD-10-CM

## 2012-04-12 DIAGNOSIS — R918 Other nonspecific abnormal finding of lung field: Principal | ICD-10-CM

## 2012-04-12 DIAGNOSIS — Z9981 Dependence on supplemental oxygen: Secondary | ICD-10-CM | POA: Insufficient documentation

## 2012-04-12 DIAGNOSIS — M549 Dorsalgia, unspecified: Secondary | ICD-10-CM

## 2012-04-12 DIAGNOSIS — I2510 Atherosclerotic heart disease of native coronary artery without angina pectoris: Secondary | ICD-10-CM

## 2012-04-12 DIAGNOSIS — I422 Other hypertrophic cardiomyopathy: Secondary | ICD-10-CM

## 2012-04-12 HISTORY — DX: Unspecified osteoarthritis, unspecified site: M19.90

## 2012-04-12 HISTORY — DX: Hyperlipidemia, unspecified: E78.5

## 2012-04-12 HISTORY — DX: Hypothyroidism, unspecified: E03.9

## 2012-04-12 HISTORY — PX: VIDEO BRONCHOSCOPY WITH ENDOBRONCHIAL NAVIGATION: SHX6175

## 2012-04-12 HISTORY — DX: Nonexudative age-related macular degeneration, unspecified eye, stage unspecified: H35.3190

## 2012-04-12 LAB — CBC
HCT: 40.8 % (ref 36.0–46.0)
HCT: 31.4 % — ABNORMAL LOW (ref 36.0–46.0)
Hemoglobin: 10.2 g/dL — ABNORMAL LOW (ref 12.0–15.0)
MCH: 29.7 pg (ref 26.0–34.0)
MCHC: 34.3 g/dL (ref 30.0–36.0)
MCHC: 33.4 g/dL (ref 30.0–36.0)
MCV: 88.1 fL (ref 78.0–100.0)
MCV: 89.7 fL (ref 78.0–100.0)
MCV: 89.5 fL (ref 78.0–100.0)
Platelets: 235 10*3/uL (ref 150–400)
RBC: 3.44 MIL/uL — ABNORMAL LOW (ref 3.87–5.11)
RDW: 14.2 % (ref 11.5–15.5)
RDW: 14.2 % (ref 11.5–15.5)

## 2012-04-12 LAB — COMPREHENSIVE METABOLIC PANEL
ALT: 18 U/L (ref 0–35)
Albumin: 2.9 g/dL — ABNORMAL LOW (ref 3.5–5.2)
Alkaline Phosphatase: 77 U/L (ref 39–117)
Chloride: 103 mEq/L (ref 96–112)
GFR calc Af Amer: 64 mL/min — ABNORMAL LOW (ref >90)
Glucose, Bld: 111 mg/dL — ABNORMAL HIGH (ref 70–99)
Potassium: 3.5 mEq/L (ref 3.5–5.1)
Sodium: 139 mEq/L (ref 135–145)
Total Bilirubin: 0.4 mg/dL (ref 0.3–1.2)
Total Protein: 7.2 g/dL (ref 6.0–8.3)

## 2012-04-12 LAB — BASIC METABOLIC PANEL
BUN: 10 mg/dL (ref 6–23)
Calcium: 8.1 mg/dL — ABNORMAL LOW (ref 8.4–10.5)
Creatinine, Ser: 0.78 mg/dL (ref 0.50–1.10)
GFR calc Af Amer: 86 mL/min — ABNORMAL LOW (ref >90)
GFR calc non Af Amer: 74 mL/min — ABNORMAL LOW (ref >90)

## 2012-04-12 LAB — GLUCOSE, CAPILLARY: Glucose-Capillary: 140 mg/dL — ABNORMAL HIGH (ref 70–99)

## 2012-04-12 LAB — PREPARE RBC (CROSSMATCH)

## 2012-04-12 LAB — ABO/RH: ABO/RH(D): O POS

## 2012-04-12 SURGERY — VIDEO BRONCHOSCOPY WITH ENDOBRONCHIAL NAVIGATION
Anesthesia: General | Site: Bronchus | Laterality: Bilateral | Wound class: Clean Contaminated

## 2012-04-12 MED ORDER — LACTATED RINGERS IV SOLN
INTRAVENOUS | Status: DC | PRN
  Administered 2012-04-12 (×2): via INTRAVENOUS

## 2012-04-12 MED ORDER — LACTATED RINGERS IV SOLN: INTRAVENOUS | Status: DC

## 2012-04-12 MED ORDER — FENTANYL CITRATE 0.05 MG/ML IJ SOLN
INTRAMUSCULAR | Status: DC | PRN
  Administered 2012-04-12 (×2): 50 ug via INTRAVENOUS

## 2012-04-12 MED ORDER — EPINEPHRINE HCL 1 MG/ML IJ SOLN
INTRAMUSCULAR | Status: DC | PRN
  Administered 2012-04-12: 1 mg via ENDOTRACHEOPULMONARY

## 2012-04-12 MED ORDER — GLYCOPYRROLATE 0.2 MG/ML IJ SOLN
INTRAMUSCULAR | Status: DC | PRN
  Administered 2012-04-12: .2 mg via INTRAVENOUS

## 2012-04-12 MED ORDER — 0.9 % SODIUM CHLORIDE (POUR BTL) OPTIME
TOPICAL | Status: DC | PRN
  Administered 2012-04-12: 1000 mL

## 2012-04-12 MED ORDER — DEXTROSE-NACL 5-0.9 % IV SOLN
INTRAVENOUS | Status: DC
  Administered 2012-04-12: 50 mL/h via INTRAVENOUS

## 2012-04-12 MED ORDER — LIDOCAINE HCL 4 % MT SOLN
OROMUCOSAL | Status: DC | PRN
  Administered 2012-04-12: 4 mL via TOPICAL

## 2012-04-12 MED ORDER — ACETAMINOPHEN 650 MG RE SUPP: 650.0000 mg | Freq: Four times a day (QID) | RECTAL | Status: DC | PRN

## 2012-04-12 MED ORDER — PROPOFOL 10 MG/ML IV BOLUS
INTRAVENOUS | Status: DC | PRN
  Administered 2012-04-12: 50 mg via INTRAVENOUS
  Administered 2012-04-12: 150 mg via INTRAVENOUS
  Administered 2012-04-12: 50 mg via INTRAVENOUS

## 2012-04-12 MED ORDER — FENTANYL CITRATE 0.05 MG/ML IJ SOLN
INTRAMUSCULAR | Status: AC
  Filled 2012-04-12: qty 2

## 2012-04-12 MED ORDER — ONDANSETRON HCL 4 MG/2ML IJ SOLN
INTRAMUSCULAR | Status: DC | PRN
  Administered 2012-04-12: 4 mg via INTRAVENOUS

## 2012-04-12 MED ORDER — SODIUM CHLORIDE 0.9 % IJ SOLN
3.0000 mL | Freq: Two times a day (BID) | INTRAMUSCULAR | Status: DC
  Administered 2012-04-12 – 2012-04-13 (×3): 3 mL via INTRAVENOUS

## 2012-04-12 MED ORDER — EPINEPHRINE HCL 1 MG/ML IJ SOLN
INTRAMUSCULAR | Status: AC
  Filled 2012-04-12: qty 1

## 2012-04-12 MED ORDER — LEVOTHYROXINE SODIUM 75 MCG PO TABS
75.0000 ug | ORAL_TABLET | Freq: Every day | ORAL | Status: DC
  Administered 2012-04-13: 75 ug via ORAL
  Filled 2012-04-12 (×2): qty 1

## 2012-04-12 MED ORDER — ROCURONIUM BROMIDE 100 MG/10ML IV SOLN
INTRAVENOUS | Status: DC | PRN
Start: 2012-04-12 — End: 2012-04-12
  Administered 2012-04-12: 25 mg via INTRAVENOUS

## 2012-04-12 MED ORDER — ONDANSETRON HCL 4 MG/2ML IJ SOLN: 4.0000 mg | Freq: Four times a day (QID) | INTRAMUSCULAR | Status: DC | PRN

## 2012-04-12 MED ORDER — ACETAMINOPHEN 325 MG PO TABS: 650.0000 mg | ORAL_TABLET | Freq: Four times a day (QID) | ORAL | Status: DC | PRN

## 2012-04-12 MED ORDER — SODIUM CHLORIDE 0.9 % IV SOLN
10.0000 mg | INTRAVENOUS | Status: DC | PRN
  Administered 2012-04-12: 10 ug/min via INTRAVENOUS

## 2012-04-12 MED ORDER — ALBUTEROL SULFATE (5 MG/ML) 0.5% IN NEBU
2.5000 mg | INHALATION_SOLUTION | RESPIRATORY_TRACT | Status: DC | PRN
  Administered 2012-04-12 – 2012-04-13 (×3): 2.5 mg via RESPIRATORY_TRACT
  Filled 2012-04-12 (×3): qty 0.5

## 2012-04-12 MED ORDER — FENTANYL CITRATE 0.05 MG/ML IJ SOLN: 25.0000 ug | INTRAMUSCULAR | Status: DC | PRN

## 2012-04-12 MED ORDER — MEPERIDINE HCL 25 MG/ML IJ SOLN: 6.2500 mg | INTRAMUSCULAR | Status: DC | PRN

## 2012-04-12 MED ORDER — MORPHINE SULFATE 2 MG/ML IJ SOLN: 1.0000 mg | INTRAMUSCULAR | Status: DC | PRN

## 2012-04-12 MED ORDER — LIDOCAINE HCL (CARDIAC) 20 MG/ML IV SOLN
INTRAVENOUS | Status: DC | PRN
  Administered 2012-04-12: 50 mg via INTRAVENOUS

## 2012-04-12 MED ORDER — ONDANSETRON HCL 4 MG PO TABS: 4.0000 mg | ORAL_TABLET | Freq: Four times a day (QID) | ORAL | Status: DC | PRN

## 2012-04-12 MED ORDER — ATORVASTATIN CALCIUM 20 MG PO TABS
20.0000 mg | ORAL_TABLET | Freq: Every day | ORAL | Status: DC
  Administered 2012-04-12 – 2012-04-13 (×2): 20 mg via ORAL
  Filled 2012-04-12 (×2): qty 1

## 2012-04-12 MED ORDER — PROMETHAZINE HCL 25 MG/ML IJ SOLN: 6.2500 mg | INTRAMUSCULAR | Status: DC | PRN

## 2012-04-12 MED ORDER — NEOSTIGMINE METHYLSULFATE 1 MG/ML IJ SOLN
INTRAMUSCULAR | Status: DC | PRN
  Administered 2012-04-12: 1 mg via INTRAVENOUS

## 2012-04-12 SURGICAL SUPPLY — 32 items
BRUSH SUPERTRAX BIOPSY (INSTRUMENTS) IMPLANT
BRUSH SUPERTRAX NDL-TIP CYTO (INSTRUMENTS) IMPLANT
CANISTER SUCTION 2500CC (MISCELLANEOUS) ×2 IMPLANT
CHANNEL WORK EXTEND EDGE 180 (KITS) IMPLANT
CHANNEL WORK EXTEND EDGE 45 (KITS) IMPLANT
CHANNEL WORK EXTEND EDGE 90 (KITS) IMPLANT
CLOTH BEACON ORANGE TIMEOUT ST (SAFETY) ×2 IMPLANT
CONT SPEC 4OZ CLIKSEAL STRL BL (MISCELLANEOUS) ×2 IMPLANT
COVER TABLE BACK 60X90 (DRAPES) ×2 IMPLANT
FILTER STRAW FLUID ASPIR (MISCELLANEOUS) IMPLANT
FORCEPS BIOP SUPERTRX PREMAR (INSTRUMENTS) IMPLANT
FORCEPS RADIAL JAW LRG 4 PULM (INSTRUMENTS) ×1 IMPLANT
GLOVE BIOGEL M STRL SZ7.5 (GLOVE) ×2 IMPLANT
KIT LOCATABLE GUIDE (CANNULA) IMPLANT
KIT MARKER FIDUCIAL DELIVERY (KITS) IMPLANT
KIT PROCEDURE EDGE 180 (KITS) ×2 IMPLANT
KIT PROCEDURE EDGE 45 (KITS) IMPLANT
KIT PROCEDURE EDGE 90 (KITS) IMPLANT
KIT ROOM TURNOVER OR (KITS) ×2 IMPLANT
MARKER SKIN DUAL TIP RULER LAB (MISCELLANEOUS) ×2 IMPLANT
NEEDLE SUPERTRX PREMARK BIOPSY (NEEDLE) IMPLANT
NS IRRIG 1000ML POUR BTL (IV SOLUTION) ×2 IMPLANT
OIL SILICONE PENTAX (PARTS (SERVICE/REPAIRS)) ×2 IMPLANT
PAD ARMBOARD 7.5X6 YLW CONV (MISCELLANEOUS) ×4 IMPLANT
PATCHES PATIENT (LABEL) ×2 IMPLANT
RADIAL JAW LRG 4 PULMONARY (INSTRUMENTS) ×1
SPONGE GAUZE 4X4 12PLY (GAUZE/BANDAGES/DRESSINGS) ×4 IMPLANT
SYR 20ML ECCENTRIC (SYRINGE) ×4 IMPLANT
SYRINGE TOOMEY DISP (SYRINGE) ×2 IMPLANT
TOWEL OR 17X24 6PK STRL BLUE (TOWEL DISPOSABLE) ×2 IMPLANT
TRAP SPECIMEN MUCOUS 40CC (MISCELLANEOUS) ×4 IMPLANT
TUBE CONNECTING 12X1/4 (SUCTIONS) ×2 IMPLANT

## 2012-04-12 NOTE — Anesthesia Postprocedure Evaluation (Signed)
  Anesthesia Post-op Note  Patient: Laurie [name]  Procedure(s) Performed: Procedure(s) (LRB): VIDEO BRONCHOSCOPY WITH ENDOBRONCHIAL NAVIGATION (Bilateral)  Patient Location: PACU  Anesthesia Type: General  Level of Consciousness: awake and alert   Airway and Oxygen Therapy: Patient Spontanous Breathing. unlabored  Post-op Pain: mild  Post-op Assessment: Post-op Vital signs reviewed, Patient's Cardiovascular Status Stable, Respiratory Function Stable, Patent Airway and No signs of Nausea or vomiting  Last Vitals:  Filed Vitals:   04/12/12 1100  BP: 123/57  Pulse: 65  Temp:   Resp: 24    Post-op Vital Signs: stable   Complications: No apparent anesthesia complications

## 2012-04-12 NOTE — Transfer of Care (Signed)
Immediate Anesthesia Transfer of Care Note  Patient: Laurie [name]  Procedure(s) Performed: Procedure(s): VIDEO BRONCHOSCOPY WITH ENDOBRONCHIAL NAVIGATION (Bilateral)  Patient Location: PACU  Anesthesia Type:General  Level of Consciousness: awake, alert  and oriented  Airway & Oxygen Therapy: Patient Spontanous Breathing and Patient connected to face mask oxygen  Post-op Assessment: Report given to PACU RN and Post -op Vital signs reviewed and stable  Post vital signs: Reviewed and stable  Complications: No apparent anesthesia complications

## 2012-04-12 NOTE — H&P (Signed)
From Dr Jane Canary Offices notes:   HPI  Laurie [name],Laurie [name], Laurie [name] is pcp. NP is Laurie [name]  Body mass index is 24.79 kg/(m^2). reports that she has never smoked. She has never used smokeless tobacco.  IOV 12/26/2011  86 year ld Widow since 2009. Here with daughter Laurie [name]. GRandaiughter is Laurie [name] 2100  She also has associaed cough for many years. Cough is mostly mild but periodically hard. QUality is Dry cough . Will cough spontaneously and periodically cough is aggarvated by choking sensation during drinking water but happens only periodically. Cough is never for solids. Stable since onset. RSI cough score is 20 and c/w irritable larynx/ LPR cooug And koffuman cough differntiator Is 5 for both GERD and Neurogenic/Airway related cause of cough  c/o insdious onset of wheeze x few months. Aggravated by sleeping on left side and occ when she sits. Relieved by turning to right side. Severity is mild. Never tried inhalers for same. Denies associated dyspnea anytime, hemoptysis, edema (other than baseline), pnd, sinus drainage, orthopnea. For the wheeze she had CT scan chest (done at [facility]) and she forgot to bring disc  She has associated occ GERd per her hx (she does not remember sept 2010 Barium study favoring obstruction).  LABS  Sept 2010  -<1. Partial obstruction distal esophagus of ingested 13 mm barium<BR>tablet without structurally obstructing lesion favoring probable  2. Slight induced gastroesophageal reflux.  <3. Otherwise, negative.  CT May 2007  - Bilateral UL nodular L > R densities and apparently progressive compared to [name] 2005 CT chest  SEpt 2010 CXR  - LUL density  CT chest Oct 2013  - patient forgot to bring disc  REC  #Wheezing  - pleae have full PFt breathing test  - please bring your CD rom of ct chest  #Cough  Do cough score  #Followup  After pft  Bring cd rom ct chest  OV 02/06/2012  FU wheezing and cough after PFT and CT chest  PFT - 02/06/2012  - spiro  shows obstruction with positive BD Response, normal lung vlumes and mild low diffusion  - fev1 1.12L/74%, DVC 2.5L/75%, Ratio 69, Small airways - 39%. 15% BD response infev1, TLC 3.7/80%, RV 97%, DLCO 13/76%  CT chest from [facility]: Oct 11, 20123 - I see RLL superior segment and LUL posterior segment nodular consolidation bilaterally that appears fairly large with ill defined borders. There is not much significant mediastina/hilar nodes  Clearly the CT Findings explain her symptoms and PFT. I then re-reviewed her hx. She and duaghter Laurie [name] forgot [name] lot of it but were able to recollect slowly. She has [name] CT in our system May 2007 where she has similar findings as current but much less prominent but even back then L was >r than right. The left lesion in May 2007 measured 4.5 x 3.3cm. IT appears she had had [name] CT in May 2005 at the then [facility] radiology and the findings in 2005 was one of even less prominence.  So overall CTs shows slow but steady grwoth since 2005 to 2013  She appears to have had bronch with TBBX on 03/03/2003 (? Site) and 10/31/2005 in lingular (? Dr Sherene Sires did both): non diagnostic bronch both times. Apparently after first one she bled [name] lot and is therefore fearful of bronch  Past, Family, Social reviewed: no change since last visit  Possibilities are  - slow growing lung cancer called Bronchoalveolar lung cancer (biopsies can be "negative") and seen in non  smoking women your age  - BOOP (local form)  - Acid reflux or chronic aspiration  - Sarcoidosis  - Related to autoimmune disease like lupus, wegner or rheumatoid arthritis  PLAN  - do blood test for autoimmune disease and sarcoid  - have to research about doing blood test for lung cancer antigen; Will let you know when and if we get it   OV 03/12/2012  Here to discuss results.  - oncimmune specific antigen panel for lung cancer - negative  - XPRESYS - lung cancer antigen test with negative predictive value - indeterminate  -  ACE borderline highg  - Autoimmune negative  - She presents with daugther. Cough still bothers her. Lack of etiology frustrating them. No new issues   04/12/2012 Laurie [name]:  Pt presents for FOB + ENB to fully evaluate RLL superior segmental PET positive lesion and bilateral less well-defined GGI/nodular infiltrates. No new events since her evaluation with Dr Marchelle Gearing. Procedure explained and questions answered.   Past Medical History  Diagnosis Date  . Coronary artery disease     NONOBSTRUCTIVE WITH LVH AND PROMINENT THEBESIAN VEINS, CURRENTLY WITHOUT CHEST PAIN  . SOB (shortness of breath)     WITH WALKING UP STAIRS  . Incontinence   . Back pain   . Asymmetric septal hypertrophy   . Mitral regurgitation   . Pulmonary hypertension   . TIA (transient ischemic attack)   . Syncope and collapse     HISTORY SYNCOPE  . History of vein stripping   . Esophagitis   . Other nonspecific abnormal finding of lung field   . Benign neoplasm of colon 02/06/2005    Tubular adenomatous  . Diverticulosis of colon (without mention of hemorrhage)   . Complication of anesthesia     hard to sedate  . Hyperlipidemia     takes Lipitor daily  . Hypertension     takes Amlodipine and Metoprolol daily and Losartan  . History of bronchitis     yrs ago  . Headache     occasionally  . Arthritis   . Joint pain     takes Anaprox prn  . Bruises easily   . GERD (gastroesophageal reflux disease)     takes Prilosec prn  . Constipation   . Urinary frequency   . Urinary urgency   . Hypothyroidism     takes Synthroid daily  . Macular degeneration, dry     mild     Family History  Problem Relation Age of Onset  . Heart disease Mother   . Prostate cancer Father   . Hypertension Father      History   Social History  . Marital Status: Widowed    Spouse Name: N/[name]    Number of Children: 4  . Years of Education: N/[name]   Occupational History  .     Social History Main Topics  . Smoking status: Never  Smoker   . Smokeless tobacco: Never Used  . Alcohol Use: No  . Drug Use: No  . Sexually Active: No   Other Topics Concern  . Not on file   Social History Narrative  . No narrative on file     Allergies  Allergen Reactions  . Amoxicillin   . Lodine (Etodolac)       Filed Vitals:   04/12/12 0609  BP: 151/84  Pulse: 69  Temp: 97.1 F (36.2 C)  Resp: 18   Gen: Pleasant, well-nourished, in no distress,  normal affect  ENT:  No lesions,  mouth clear,  oropharynx clear, no postnasal drip  Neck: No JVD, no TMG, no carotid bruits  Lungs: No use of accessory muscles, no dullness to percussion, clear without rales or rhonchi  Cardiovascular: RRR, heart sounds normal, no murmur or gallops, no peripheral edema  Musculoskeletal: No deformities, no cyanosis or clubbing  Neuro: alert, non focal  Skin: Warm, no lesions or rashes   Recent Labs Lab 04/12/12 0603  HGB 14.0  HCT 40.8  WBC 4.7  PLT 235    Recent Labs Lab 04/12/12 0603  NA 139  K 3.5  CL 103  CO2 29  GLUCOSE 111*  BUN 12  CREATININE 0.92  CALCIUM 9.0    Recent Labs Lab 04/12/12 0603  INR 0.99    Principal Problem:   Pulmonary infiltrates Active Problems:   BRONCHIECTASIS   SOB (shortness of breath)   Wheeze   Plan:  DDx >> malignancy, auto-immune or granulomatous inflammatory process, COP  We will proceed with ENB, bx's and washes this am, follow results with Dr Marchelle Gearing  Levy Pupa, MD, PhD 04/12/2012, 7:34 AM [facility] [phone] or if no answer [phone]

## 2012-04-12 NOTE — Anesthesia Procedure Notes (Signed)
Procedure Name: Intubation Performed by: Armandina Gemma MARIE Pre-anesthesia Checklist: Patient identified, Emergency Drugs available, Suction available, Patient being monitored and Timeout performed Patient Re-evaluated:Patient Re-evaluated prior to inductionOxygen Delivery Method: Circle system utilized Preoxygenation: Pre-oxygenation with 100% oxygen Intubation Type: IV induction Ventilation: Mask ventilation without difficulty Laryngoscope Size: Miller and 2 Tube size: 8.0 mm Number of attempts: 2 Airway Equipment and Method: Stylet Placement Confirmation: ETT inserted through vocal cords under direct vision,  positive ETCO2 and breath sounds checked- equal and bilateral Secured at: 21 cm Tube secured with: Tape Dental Injury: Teeth and Oropharynx as per pre-operative assessment  Comments: Dr Acey Lav assisted and supervised EMT student-  Herbert Seta with airway and intubation. Heather used a MAC 3 and intubated the esophagus- the ETT was removed and CRNA Braylei Totino used a MIller 2 and inserted a 8.0 ETT- both attempts were atraumatic.

## 2012-04-12 NOTE — Anesthesia Preprocedure Evaluation (Signed)
Anesthesia Evaluation  Patient identified by MRN, date of birth, ID band Patient awake    Reviewed: Allergy & Precautions, H&P , NPO status , Patient's Chart, lab work & pertinent test results  History of Anesthesia Complications Negative for: history of anesthetic complications  Airway Mallampati: II TM Distance: >3 FB Neck ROM: Full    Dental no notable dental hx.    Pulmonary neg pulmonary ROS, neg shortness of breath,  breath sounds clear to auscultation  Pulmonary exam normal       Cardiovascular hypertension, Pt. on medications + CAD negative cardio ROS  Rhythm:Regular Rate:Normal  Pulmonary hypertension   Neuro/Psych negative neurological ROS  negative psych ROS   GI/Hepatic negative GI ROS, Neg liver ROS,   Endo/Other  negative endocrine ROSHypothyroidism   Renal/GU negative Renal ROS  negative genitourinary   Musculoskeletal negative musculoskeletal ROS (+)   Abdominal   Peds negative pediatric ROS (+)  Hematology negative hematology ROS (+)   Anesthesia Other Findings   Reproductive/Obstetrics negative OB ROS                           Anesthesia Physical Anesthesia Plan  ASA: III  Anesthesia Plan: General   Post-op Pain Management:    Induction: Intravenous  Airway Management Planned: Oral ETT  Additional Equipment:   Intra-op Plan:   Post-operative Plan: Extubation in OR  Informed Consent: I have reviewed the patients History and Physical, chart, labs and discussed the procedure including the risks, benefits and alternatives for the proposed anesthesia with the patient or authorized representative who has indicated his/her understanding and acceptance.   Dental advisory given  Plan Discussed with: CRNA  Anesthesia Plan Comments:         Anesthesia Quick Evaluation

## 2012-04-12 NOTE — Interval H&P Note (Signed)
79 year old woman with a history of nonobstructive coronary artery disease, mitral regurgitation, esophagitis/GERD with ? Partial obstruction 10/2008. She has a history of bilateral upper lobe nodular densities that date back as far as 2005 a CT scan of the chest. She has been under evaluation for cough by Dr. Marchelle Gearing which led to followup imaging, CT scan 01/2012 and 03/2012. Her B infiltrates have shown progression since her initial evaluation, at which time she had inconclusive bx's by Dr Sherene Sires. PET scan revealed hypermetabolism in the superior segment RLL. Decision was made to pursue bronchoscopy with ENB for brushings and biopsies. This was done on 04/12/12. Needle bx's and brushings were obtained after which she had significant hemoptysis from the RLL (approximately 400cc). Hemostasis was achieved after approximately 35 minutes and she was taken to the PACU. She was hemodynamically stable throughout the case. She will be admitted for observation given the large blood loss.   Patient Vitals for the past 24 hrs:  BP Temp Temp src Pulse Resp SpO2 Height Weight  04/12/12 1130 - - - 65 17 100 % - -  04/12/12 1115 112/68 mmHg - - 60 17 100 % - -  04/12/12 1100 123/57 mmHg - - 65 24 100 % - -  04/12/12 1045 121/52 mmHg - - 65 21 100 % - -  04/12/12 1039 124/66 mmHg - - 64 19 100 % - -  04/12/12 1035 137/55 mmHg - - 66 21 100 % - -  04/12/12 1031 129/51 mmHg - - 56 23 100 % - -  04/12/12 1030 100/43 mmHg - - 53 21 100 % - -  04/12/12 1022 87/32 mmHg 97.2 F (36.2 C) - - 20 - - -  04/12/12 0612 - - - - - - - 66 kg (145 lb 8.1 oz)  04/12/12 0609 151/84 mmHg 97.1 F (36.2 C) Oral 69 18 99 % 5\' 4"  (1.626 m) -   Gen: Pleasant, well-nourished, in no distress, sleepy but interacting  ENT: No lesions,  mouth clear,  oropharynx clear, no postnasal drip  Neck: No JVD, no TMG, no carotid bruits  Lungs: No use of accessory muscles, coarse B without wheezing  Cardiovascular: RRR, heart sounds normal, no  murmur or gallops, no peripheral edema  Musculoskeletal: No deformities, no cyanosis or clubbing  Neuro: alert, non focal  Skin: cool, no lesions or rashes

## 2012-04-12 NOTE — Preoperative (Signed)
Beta Blockers   Reason not to administer Beta Blockers:Not Applicable 

## 2012-04-12 NOTE — Op Note (Signed)
Video Bronchoscopy with Electromagnetic Navigation Procedure Note  Date of Operation: 04/12/2012  Pre-op Diagnosis: Bilateral pulmonary infiltrates  Post-op Diagnosis: same  Surgeon: Levy Pupa  Assistants: none  Anesthesia: General endotracheal anesthesia  Operation: Flexible video fiberoptic bronchoscopy with electromagnetic navigation and biopsies.  Estimated Blood Loss: 400cc  Complications: There was abnormal and significant bleeding from the superior segment of the right lower lobe, approximately 400cc as above.   Indications and History: Laurie [name] is a 79 y.o. female never smoker with bilateral pulmonary infiltrates underlying relation by Dr. Marchelle Gearing. A focal area of hypermetabolic activity in the superior segment of the right lower lobe had been noted on PET scan. It was recommended that biopsies and cytology via bronchoscopy with electromagnetic navigation be obtained from this area in order to achieve a tissue diagnosis.  The risks, benefits, complications, treatment options and expected outcomes were discussed with the patient.  The possibilities of pneumothorax, pneumonia, reaction to medication, pulmonary aspiration, perforation of a viscus, bleeding, failure to diagnose a condition and creating a complication requiring transfusion or operation were discussed with the patient who freely signed the consent.    Description of Procedure: The patient was seen in the Preoperative Area, was examined and was deemed appropriate to proceed.  The patient was taken to OR 10, identified as Laurie [name] and the procedure verified as Flexible Video Fiberoptic Bronchoscopy.  A Time Out was held and the above information confirmed.   Prior to the date of the procedure a high-resolution CT scan of the chest was performed. Utilizing superDimension software a virtual tracheobronchial tree was generated to allow the creation of distinct navigation pathways to the patient's bilateral  parenchymal abnormalities. After being taken to the operating room general anesthesia was initiated and the patient  was orally intubated. The video fiberoptic bronchoscope was introduced via the endotracheal tube and a general inspection was performed which showed normal airways. The extendable working channel and locator guide were introduced into the bronchoscope. The distinct navigation pathway prepared prior to this procedure was then utilized to navigate to within 1.5cm of patient's RLL superior segment lesion identified on CT scan (and hot on PET scan). The extendable working channel was secured into place and the locator guide was withdrawn. Under fluoroscopic guidance transbronchial brushings and transbronchial Wang needle biopsies were performed to be sent for cytology. Following the second set of brushings steady hemoptysis was noted from the involved airway in the superior segment of the right lower lobe. This was enough initially to obscure visibility and to produce clot in the left mainstem bronchus. Suctioning and tamponade were performed with the bronchoscope with slow improvement in her bleeding and in visibility. Diluted epinephrine (1mg  in 250cc NS) was instilled into the RLL airways, 55cc in divided does. After approximately 35 minutes bleeding appeared to be subsiding. It was then possible to better examine the airways. There was clot in the RLL superior segment without any active oozing.   Loose clot was also noted in other airways and was suctioned away where possible. A bronchioalveolar lavage was performed in the RUL and sent for microbiology (bacterial, fungal, AFB smears and cultures). At the end of the procedure a general airway inspection was performed and bleeding appeared to have stopped. There was no hemodynamic instability or change in oxygenation or ventilation throughout the entire case.The bronchoscope was removed. Anesthesia was reversed and the patient was taken to the PACU for  recovery.  A post-procedural chest x-ray is pending. She will be  admitted to a stepdown bed for observation.   Samples: 1. Transbronchial brushings from RLL superior segment 2. Transbronchial Wang needle biopsies from RLL superior segment 3. Bronchoalveolar lavage from RUL  Plans:  The patient will be admitted to SDU for observation to ensure no further bleeding. We will review the cytology, pathology and microbiology results with the patient when they become available. Hopefully we will be able to discharge from the hospital on 04/13/12.    Levy Pupa, MD, PhD 04/12/2012, 10:59 AM [facility] [phone] or if no answer [phone]

## 2012-04-12 NOTE — Interval H&P Note (Signed)
Principal Problem:   Major hemoptysis Active Problems:   Pulmonary infiltrates   HYPOTHYROIDISM   HYPERLIPIDEMIA   Hypertension   GERD (gastroesophageal reflux disease)   Coronary artery disease   BRONCHIECTASIS   Wheeze  Plans:  - admit to SDU for observation  - pulmonary toilet - serial CBC's - hold anti-HTN meds initially until we confirm stability - continue her home synthroid and statin - follow needle and brush results - hopefully home on 2/18  Levy Pupa, MD, PhD 04/12/2012, 12:09 PM [facility] [phone] or if no answer [phone]

## 2012-04-13 ENCOUNTER — Observation Stay (HOSPITAL_COMMUNITY): Payer: Medicare HMO

## 2012-04-13 LAB — CBC
HCT: 28.3 % — ABNORMAL LOW (ref 36.0–46.0)
Hemoglobin: 8.8 g/dL — ABNORMAL LOW (ref 12.0–15.0)
Hemoglobin: 9.3 g/dL — ABNORMAL LOW (ref 12.0–15.0)
MCH: 29.6 pg (ref 26.0–34.0)
MCH: 29.5 pg (ref 26.0–34.0)
MCV: 89.8 fL (ref 78.0–100.0)
RBC: 2.97 MIL/uL — ABNORMAL LOW (ref 3.87–5.11)
RBC: 3.15 MIL/uL — ABNORMAL LOW (ref 3.87–5.11)
WBC: 9.2 10*3/uL (ref 4.0–10.5)
WBC: 8.1 10*3/uL (ref 4.0–10.5)

## 2012-04-13 LAB — BASIC METABOLIC PANEL
CO2: 29 mEq/L (ref 19–32)
Chloride: 105 mEq/L (ref 96–112)
Glucose, Bld: 106 mg/dL — ABNORMAL HIGH (ref 70–99)
Potassium: 3.8 mEq/L (ref 3.5–5.1)
Sodium: 139 mEq/L (ref 135–145)

## 2012-04-13 MED ORDER — METOPROLOL SUCCINATE ER 100 MG PO TB24: 100.0000 mg | ORAL_TABLET | Freq: Every day | ORAL | Status: AC

## 2012-04-13 MED ORDER — AMLODIPINE BESYLATE 2.5 MG PO TABS: 2.5000 mg | ORAL_TABLET | Freq: Every day | ORAL | Status: AC

## 2012-04-13 MED ORDER — LOSARTAN POTASSIUM 100 MG PO TABS: 100.0000 mg | ORAL_TABLET | Freq: Every day | ORAL | Status: AC

## 2012-04-13 NOTE — Progress Notes (Signed)
Utilization Review Completed.  04/13/2012  

## 2012-04-13 NOTE — Progress Notes (Signed)
HPI:  79 yo woman with a history of nonobstructive coronary artery disease, mitral regurgitation, esophagitis/GERD with ? Partial obstruction 10/2008. She has a history of bilateral upper lobe nodular densities that date back as far as 2005 a CT scan of the chest. She has been under evaluation for cough by Dr. Marchelle Gearing which led to followup imaging, CT scan 01/2012 and 03/2012. Her B infiltrates have shown progression since her initial evaluation, at which time she had inconclusive bx's by Dr Sherene Sires. PET scan revealed hypermetabolism in the superior segment RLL. Decision was made to pursue bronchoscopy with ENB for brushings and biopsies. This was done on 04/12/12. Needle bx's and brushings were obtained after which she had significant hemoptysis from the RLL (approximately 400cc). Hemostasis was achieved after approximately 35 minutes and she was taken to the PACU. She was hemodynamically stable throughout the case. She will be admitted for observation given the large blood loss.   Subjective:  Feels a bit fatigued Has coughed up some dark blood, no BRB  Filed Vitals:   04/13/12 0833  BP: 124/57  Pulse: 86  Temp: 98.6 F (37 C)  Resp: 29   SpO2 Readings from Last 3 Encounters:  04/13/12 96%  04/13/12 96%  03/12/12 96%   Gen: Pleasant, elderly, pale, in no distress,  normal affect  ENT: No lesions,  mouth clear,  oropharynx clear, no postnasal drip  Neck: No JVD, no TMG, no carotid bruits  Lungs: No use of accessory muscles, no dullness to percussion, clear without rales or rhonchi  Cardiovascular: RRR, heart sounds normal, no murmur or gallops, no peripheral edema  Musculoskeletal: No deformities, no cyanosis or clubbing  Neuro: alert, non focal  Skin: Warm, no lesions or rashes  Principal Problem:   Major hemoptysis Active Problems:   Pulmonary infiltrates   HYPOTHYROIDISM   HYPERLIPIDEMIA   Hypertension   GERD (gastroesophageal reflux disease)   Coronary artery disease  BRONCHIECTASIS   Wheeze  Hgb has dropped to 8.8. She is clinically stable but has not ambulated, etc.   Plans:  - mobilize today - recheck Hgb at 12:00 - hold the anti-HTN regimen for now, may need to be held for a few days post discharge - follow path results - hopeful for discharge to home today if Hgb stable and able to ambulate - f/u to be w Dr Marchelle Gearing  Levy Pupa, MD, PhD 04/13/2012, 8:58 AM  [facility] [phone] or if no answer [phone]

## 2012-04-13 NOTE — Progress Notes (Signed)
Discharge instructions provided to patient and family per Canary Brim NP. Patient and family verbalized understanding and have no further questions.

## 2012-04-13 NOTE — Discharge Summary (Signed)
Physician Discharge Summary  Patient ID: Laurie [name] MRN: [id number] DOB/AGE: 11/04/1924 79 y.o.  Admit date: 04/12/2012 Discharge date: 04/13/2012    Discharge Diagnoses:  Major hemoptysis Bronchiectasis Pulmonary Infiltrates Wheezing HTN CAD Hyperlipidemia Hypothyroidism GERD (gastroesophageal reflux disease)      Brief Summary: Laurie [name] is a 79 y.o. y/o female with a PMH of nonobstructive coronary artery disease, mitral regurgitation, esophagitis/GERD with ? Partial obstruction 10/2008. She has a history of bilateral upper lobe nodular densities that date back as far as 2005 a CT scan of the chest. She has been under evaluation for cough by Dr. Marchelle Gearing which led to followup imaging, CT scan 01/2012 and 03/2012. Her B infiltrates have shown progression since her initial evaluation, at which time she had inconclusive bx's by Dr Sherene Sires. PET scan revealed hypermetabolism in the superior segment RLL. Decision was made to pursue bronchoscopy with ENB for brushings and biopsies. This was done on 04/12/12. Needle bx's and brushings were obtained after which she had significant hemoptysis from the RLL (approximately 400cc). Hemostasis was achieved after approximately 35 minutes and she was taken to the PACU and was hemodynamically stable throughout the case. She was admitted for observation given the large blood loss. Patient remained hemodynamically stable off her usual blood pressure medications.  Hbg remained stable with slight improvement prior to discharge (8.8 -->9.3).  Preliminary AFB & Fungal culture with no growth.  Pathology pending.      Microbiology/Sepsis markers: BAL 2/17>>> Fungal Culture 2/17>>>neg prelim>>> AFB 2/17>>>neg prelim Cytology 2/17>>> Pathology 2/17>>>  Significant Diagnostic Studies:  2/18 CXR>>>Worsening ill-defined opacity in the left mid to lower lung may represent some retained lavage fluid given the recent bronchoscopy. Attention on follow-up studies  is recommended to exclude a developing area of infection. The appearance of chest is otherwise essentially unchanged.                                                                     Hospital Summary by Discharge Diagnosis   Major hemoptysis Bronchiectasis Pulmonary Infiltrates Wheezing She has a history of bilateral upper lobe nodular densities that date back as far as 2005 a CT scan of the chest. She has been under evaluation for cough by Dr. Marchelle Gearing which led to followup imaging, CT scan 01/2012 and 03/2012. Her B infiltrates have shown progression since her initial evaluation, at which time she had inconclusive bx's by Dr Sherene Sires. PET scan revealed hypermetabolism in the superior segment RLL. Decision was made to pursue bronchoscopy with ENB for brushings and biopsies. This was done on 04/12/12. Needle bx's and brushings were obtained after which she had significant hemoptysis from the RLL (approximately 400cc). Hemostasis was achieved after approximately 35 minutes and she was taken to the PACU. She was hemodynamically stable throughout the case. Admitted for observation given blood loss.  Remained stable during admit.  Slight drop in hemoglobin without hemodynamic compromise.  Hemoptysis slowed during observation period.  Repeat Hgb stable (slightly improved).   In setting of hemoptysis, patient was noted to have desaturations to 85% at rest.  Home O2 was arranged prior to discharge for 2L oxygen continuously.    Discharge Plan: -follow up cxr in office on 2/25 - further eval airspace disease (L) -repeat CBC  in office on 2/25 -follow up culture / pathology results  -hold ASA, no blood thinners, hold Naproxen -instructed to call MD if new blood, bright red blood, or increase in volume -oxygen at 2L continuously.  Likely will be able to come off in couple of weeks once residual blood resolved.     HTN CAD Hyperlipidemia Patient noted to be soft to normotensive post bronchoscopy with  range of 114-133 systolic the day of discharge.  She was previously on multiple medications for BP.  Concern with restarting medications all at once given normotension at time of discharge.    Discharge Plan: -hold lopressor until follow up in office on 2/25 -begin norvasc 2/19 -begin cozaar 2/20 if systolic BP > 120 -pt to check BP BID and bring record to office -follow up with Dr. Lorain Childes in office as soon as possible for blood pressure follow up (unable to make appt as office closed at time of d/c)   Hypothyroidism GERD (gastroesophageal reflux disease) No changes made to these diagnoses during admit. Previously scheduled for EGD per GI for next week.    Discharge Plan: -Continue previous home medications -recommend holding  Discharge Exam: Gen: Pleasant, elderly, pale, in no distress, normal affect  ENT: No lesions, mouth clear, oropharynx clear, no postnasal drip  Neck: No JVD, no TMG, no carotid bruits  Lungs: No use of accessory muscles, no dullness to percussion, clear without rales or rhonchi  Cardiovascular: RRR, heart sounds normal, no murmur or gallops, no peripheral edema  Musculoskeletal: No deformities, no cyanosis or clubbing  Neuro: alert, non focal  Skin: Warm, no lesions or rashes    Filed Vitals:   04/13/12 1229 04/13/12 1500 04/13/12 1521 04/13/12 1544  BP: 118/45 133/53  133/53  Pulse: 82   78  Temp: 98.7 F (37.1 C) 98.4 F (36.9 C)    TempSrc: Oral Oral Oral   Resp: 18   15  Height:      Weight:      SpO2: 90% 99%  100%     Discharge Labs  BMET  Recent Labs Lab 04/12/12 0603 04/12/12 1450 04/13/12 0505  NA 139 139 139  K 3.5 3.9 3.8  CL 103 105 105  CO2 29 29 29   GLUCOSE 111* 147* 106*  BUN 12 10 11   CREATININE 0.92 0.78 0.89  CALCIUM 9.0 8.1* 8.0*   CBC  Recent Labs Lab 04/12/12 2042 04/13/12 0505 04/13/12 1145  HGB 10.2* 8.8* 9.3*  HCT 30.8* 26.5* 28.3*  WBC 14.1* 9.2 8.1  PLT 167 154 160   Anti-Coagulation  Recent  Labs Lab 04/12/12 0603  INR 0.99    Discharge Orders   Future Appointments Provider Department Dept Phone   04/16/2012 1:30 PM Hilarie Fredrickson, MD [facility] [phone]   04/20/2012 2:15 PM Julio Sicks, NP [facility] [phone]   Future Orders Complete By Expires     Call MD for:  difficulty breathing, headache or visual disturbances  As directed     Call MD for:  persistant dizziness or light-headedness  As directed     Call MD for:  severe uncontrolled pain  As directed     Call MD for:  temperature >100.4  As directed     Call MD for:  As directed     Scheduling Instructions:      New bleeding ./ coughing up blood.    Diet general  As directed     Discharge instructions  As directed     Scheduling Instructions:      Wear oxygen 2L continuously. You may cough up some old dark blood - notify MD immediately if new blood or increase in amount of blood. NO ASPIRIN OR BLOOD THINNERS. Continue to use Incentive spirometer & flutter valve at home (breathing exercises)  Begin you Norvasc (amlodipine) on 2/19.   Monitor your blood pressure once in AM and once in PM.  On 2/20 if BP is greater than 120 systolic (top number) start your Cozaar.  If less than 120, hold Cozaar and reassess the following day.  Hold Lopressor until you see Nurse Practitioner in office on Tuesday.    Increase activity slowly  As directed             Follow-up Information   Follow up with PARRETT,TAMMY, NP On 04/20/2012. (Appt 2:15.  Arrive at [facility] for Chest Xray)    Contact information:   [name] [place] [address] [phone]       Schedule an appointment as soon as possible for a visit with ARONSON,RICHARD A, MD. (As needed)    Contact information:   [address] [facility] [place] [address] [phone]          Medication List    STOP taking these medications       aspirin 81 MG tablet     naproxen sodium  220 MG tablet  Commonly known as:  ANAPROX      TAKE these medications       amLODipine 2.5 MG tablet  Commonly known as:  NORVASC  Take 1 tablet (2.5 mg total) by mouth daily.  Start taking on:  04/14/2012     atorvastatin 40 MG tablet  Commonly known as:  LIPITOR  Take 20 mg by mouth daily.     levothyroxine 75 MCG tablet  Commonly known as:  SYNTHROID, LEVOTHROID  Take 75 mcg by mouth daily.     losartan 100 MG tablet  Commonly known as:  COZAAR  Take 1 tablet (100 mg total) by mouth daily.  Start taking on:  04/15/2012     metoprolol succinate 100 MG 24 hr tablet  Commonly known as:  TOPROL-XL  Take 1 tablet (100 mg total) by mouth daily.     multivitamin tablet  Take 1 tablet by mouth daily. occasionally     TYLENOL ARTHRITIS PAIN PO  Take 1 tablet by mouth daily as needed. For pain        Disposition: Home with home O2 at 2L per .  No other home health needs identified.   Discharged Condition: Javier Docker has met maximum benefit of inpatient care and is medically stable and cleared for discharge.  Patient is pending follow up as above.      Time spent on disposition:  Greater than 35 minutes.   Signed: Canary Brim, NP-C [facility] Pgr: [phone]

## 2012-04-13 NOTE — Progress Notes (Addendum)
Patient sitting in chair sleeping O2 sats 85% on room air. O2 3L applied sats now 97%

## 2012-04-13 NOTE — Evaluation (Signed)
Physical Therapy Evaluation Patient Details Name: Laurie [name] MRN: [id number] DOB: [birth year] Today's Date: 04/13/2012 Time: 6578-4696 PT Time Calculation (min): 23 min  PT Assessment / Plan / Recommendation Clinical Impression  Pt admitted with bronchiecstatis s/p bronchoscopy with additional bleeding. Pt mobilizing well on RA with momentary drops to 87% but majority of ambulation maintained 90-97% O2 with cueing for breathing technique. Pt and dgtr-in-law educated for energy conservation and use of incentive spirometer with pt demonstrating use of ISx 7 during eval only able to get between 750-1000cc and encouraged to continue use of IS. Pt also encouraged to use shower seat currently and to have famlily perfom housework duties for at least a week until her breathing and stamina are improved. No further therapy needs as all education completed and pt and family agreeable.     PT Assessment  Patent does not need any further PT services    Follow Up Recommendations  No PT follow up    Does the patient have the potential to tolerate intense rehabilitation      Barriers to Discharge        Equipment Recommendations  None recommended by PT    Recommendations for Other Services     Frequency      Precautions / Restrictions Precautions Precautions: Fall   Pertinent Vitals/Pain No pain      Mobility  Bed Mobility Bed Mobility: Supine to Sit;Sitting - Scoot to Edge of Bed Supine to Sit: 6: Modified independent (Device/Increase time);HOB flat Sitting - Scoot to Edge of Bed: 6: Modified independent (Device/Increase time) Transfers Transfers: Sit to Stand;Stand to Sit Sit to Stand: 6: Modified independent (Device/Increase time);From bed Stand to Sit: 6: Modified independent (Device/Increase time);To chair/3-in-1 Details for Transfer Assistance: x 2 trials with transfers without assist or cueing needed Ambulation/Gait Ambulation/Gait Assistance: 6: Modified independent  (Device/Increase time) Ambulation Distance (Feet): 250 Feet (x 2 trials with seated rest between trials) Assistive device: None Ambulation/Gait Assistance Details: WFL Gait Pattern: Within Functional Limits Stairs: Yes Stairs Assistance: 7: Independent Stair Management Technique: No rails Number of Stairs: 3    Exercises     PT Diagnosis:    PT Problem List:   PT Treatment Interventions:     PT Goals    Visit Information  Last PT Received On: 04/13/12 Assistance Needed: +1    Subjective Data  Subjective: I live by myself Patient Stated Goal: go home and take a shower   Prior Functioning  Home Living Lives With: Alone Available Help at Discharge: Family;Available PRN/intermittently Type of Home: House Home Access: Stairs to enter [employer] of Steps: 3 Entrance Stairs-Rails: None Home Layout: One level Bathroom Shower/Tub: Health visitor: Standard Home Adaptive Equipment: Hand-held shower hose;Shower chair with back Prior Function Level of Independence: Independent Able to Take Stairs?: Yes Driving: Yes Vocation: Retired Musician: No difficulties    Copywriter, advertising Overall Cognitive Status: Appears within functional limits for tasks assessed/performed Arousal/Alertness: Awake/alert Orientation Level: Appears intact for tasks assessed Behavior During Session: [facility] for tasks performed    Extremity/Trunk Assessment Right Upper Extremity Assessment RUE ROM/Strength/Tone: [facility] for tasks assessed Left Upper Extremity Assessment LUE ROM/Strength/Tone: WFL for tasks assessed Right Lower Extremity Assessment RLE ROM/Strength/Tone: Within functional levels Left Lower Extremity Assessment LLE ROM/Strength/Tone: Within functional levels Trunk Assessment Trunk Assessment: Normal   Balance    End of Session PT - End of Session Activity Tolerance: Patient tolerated treatment well Patient left: in chair;with call  bell/phone within reach;with  family/visitor present  GP     Toney Sang [facility] 04/13/2012, 9:51 AM Delaney Meigs, PT [phone]

## 2012-04-13 NOTE — Care Management Note (Signed)
    Page 1 of 1   04/13/2012     4:49:10 PM   CARE MANAGEMENT NOTE 04/13/2012  Patient:  Laurie [name], Laurie [name]   Account Number:  [id number]  Date Initiated:  04/13/2012  Documentation initiated by:  Verdis Prime  Subjective/Objective Assessment:   79 yr-old female adm for observation due to episode of hemoptysis after bronchoscopy     DC Planning Services  CM consult      DME arranged  [facility]      DME agency  [facility]   Comments:  PCP: Dr Jacky Kindle Pulmonologist: Dr Laray Anger  04/13/12 1548 Laurie Hofstra RN BSN MSN CCM Talked with pt and dtr-in-law, who is [name] Engineer, civil (consulting), about O2 sat levels that were dropping to 85-87% while pt sleeping.  Pt has never been on home O2, states she has been able to function independently quite well - dtr-in-law agrees.  Pt lives alone but family is making arrangements to provide 24/7 assistance until pt returns to baseline. 1641 O2 sats 85% on RA, talked with attending who will order O2.  TC to [employer], documentation faxed, they will bring O2 tank to pt's room when order is received.

## 2012-04-13 NOTE — Progress Notes (Signed)
O2 ,sats 86-87 on room air sitting in chaiir./ O2 2l [place] applied O2 sat 96%

## 2012-04-13 NOTE — Progress Notes (Signed)
Patient sitting in recliner on room air sats dropped to 85%. O2 3l [place] applied O2sat back up to 100%

## 2012-04-14 ENCOUNTER — Encounter (HOSPITAL_COMMUNITY): Payer: Self-pay | Admitting: Emergency Medicine

## 2012-04-14 ENCOUNTER — Telehealth: Payer: Self-pay | Admitting: Internal Medicine

## 2012-04-14 LAB — CULTURE, RESPIRATORY W GRAM STAIN: Gram Stain: NONE SEEN

## 2012-04-14 NOTE — Telephone Encounter (Signed)
No charge. 

## 2012-04-15 ENCOUNTER — Telehealth: Payer: Self-pay | Admitting: Internal Medicine

## 2012-04-15 ENCOUNTER — Telehealth: Payer: Self-pay | Admitting: Emergency Medicine

## 2012-04-15 LAB — POCT I-STAT 4, (NA,K, GLUC, HGB,HCT)
Glucose, Bld: 118 mg/dL — ABNORMAL HIGH (ref 70–99)
HCT: 32 % — ABNORMAL LOW (ref 36.0–46.0)
Hemoglobin: 10.9 g/dL — ABNORMAL LOW (ref 12.0–15.0)

## 2012-04-15 LAB — TYPE AND SCREEN
ABO/RH(D): O POS
Unit division: 0

## 2012-04-15 NOTE — Discharge Summary (Signed)
I have interviewed and examined the patient and reviewed the database. I have formulated the assessment and plan as reflected in the note above with amendments made by me.   Billy Fischer, MD;  PCCM service; Mobile [phone]

## 2012-04-15 NOTE — Progress Notes (Signed)
04/13/12 0951  PT G-Codes **NOT FOR INPATIENT CLASS**  Functional Assessment Tool Used clinical judgement  Functional Limitation Mobility: Walking and moving around  Mobility: Walking and Moving Around Current Status (Z6109) CI  Mobility: Walking and Moving Around Goal Status (U0454) CI  Mobility: Walking and Moving Around Discharge Status (U9811) CI  PT General Charges  $$ ACUTE PT VISIT 1 Procedure  PT Evaluation  $Initial PT Evaluation Tier I 1 Procedure  PT Treatments  $Therapeutic Activity 8-22 mins  Addendum to Progress Note for 04/13/12 Delaney Meigs, PT [phone]

## 2012-04-15 NOTE — Telephone Encounter (Signed)
Bring her ni asap for appt; she had massive hemptysis with bronch 2 daysago

## 2012-04-15 NOTE — Telephone Encounter (Signed)
RB  Family is calling to get the results of the biopsy.  Please advise. thanks

## 2012-04-16 ENCOUNTER — Telehealth: Payer: Self-pay | Admitting: Internal Medicine

## 2012-04-16 ENCOUNTER — Encounter: Payer: Medicare HMO | Admitting: Internal Medicine

## 2012-04-16 NOTE — Telephone Encounter (Signed)
Spoke with pt and daughter. Pt stable, coughing up old blood. Discussed results with them, suspect that entire clinical picture is consistent with slow-growing malignancy. They understand that we will discuss the case further in Thoracic conference, decide appropriateness and timing of any XRT. Discussed with Dr Marchelle Gearing

## 2012-04-16 NOTE — Telephone Encounter (Signed)
Spoke her daughter [name]  - Daughter states that she and patient are upset about hemoptysis following bronchoscopy especially the severe differential hemoptysis. This is in context of her having had hemoptysis several years ago following first bronchoscopy. I explained that we ensure that patient was not on Plavix or any antiplatelet agent, discussed at multidisciplinary meeting, did etiologic workup blood test and perform procedure under experienced hands in a controlled setting. Maximum precautions were taken but unfortunately she had hemoptysis. She verbalized understanding. She feels patient will have her undergo another procedure again  - Currently patient is deconditioned and is having old hemoptysis. She is due to see nurse practitioner on 04/20/2012  - Result of atypical cells given. Explained that pretest probability for bronchoalveolar cell carcinoma is higher than preprocedure. She wanted to know if chronic inflammation is also likely. I've agreed to this the possibility and we will discuss at our multidisciplinary cancer conference.  - When she sees nurse practitioner in 04/20/2012  - She should have a complete blood count, basic metabolic profile, chest x-ray and that check on her oxygen saturations   Thanks  MR  Cc:  Dr. Levy Pupa and Rikki Spearing

## 2012-04-16 NOTE — Telephone Encounter (Signed)
I gave Dr. Nechama Guard your cell phone number. He wants to speak to you about this patient. Carron Curie, CMA

## 2012-04-16 NOTE — Telephone Encounter (Signed)
Spoke to the patient and to Eunice Blase about pt's status, the bx results that show atypical cells. She is stable, coughing up some clots. 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 They voiced understanding that her case would be discussed in thoracic conference and then we will decide regarding appropriateness of therapy.

## 2012-04-19 NOTE — Telephone Encounter (Signed)
Who is Dr Nechama Guard and from where ?   I think this was from [facility] in [place]. He called 04/16/12 to tell me that blood work is suggestive of 80% prob of benign diagnosis but does not apply to patient because patient had mass > 3cm

## 2012-04-19 NOTE — Telephone Encounter (Signed)
Ok, I will tell TP to discuss with RB when she sees the patient  in the afternoon

## 2012-04-19 NOTE — Telephone Encounter (Signed)
I spoke with MR about this on 04-15-12 and advised his next available in in March. Pt has an appt with TP on 04-20-12, does the pt need to keep this appt or do we need to reschedule with you? Please advise. Carron Curie, CMA

## 2012-04-19 NOTE — Telephone Encounter (Signed)
Dr. Delton Coombes does not have any openings. Carron Curie, CMA

## 2012-04-19 NOTE — Telephone Encounter (Signed)
I already sppoke to them and so did Dr Delton Coombes. Ok to see TP 04/20/12 but I prefer they see Dr Delton Coombes if he has opening tomorrow ? LEt me know and I can ask him to

## 2012-04-20 ENCOUNTER — Ambulatory Visit (INDEPENDENT_AMBULATORY_CARE_PROVIDER_SITE_OTHER): Payer: Medicare HMO | Admitting: Adult Health

## 2012-04-20 ENCOUNTER — Other Ambulatory Visit (INDEPENDENT_AMBULATORY_CARE_PROVIDER_SITE_OTHER): Payer: Medicare HMO

## 2012-04-20 ENCOUNTER — Ambulatory Visit (INDEPENDENT_AMBULATORY_CARE_PROVIDER_SITE_OTHER)
Admission: RE | Admit: 2012-04-20 | Discharge: 2012-04-20 | Disposition: A | Discharge status: Home / Self Care | Payer: Medicare HMO | Source: Ambulatory Visit | Attending: Adult Health | Admitting: Adult Health

## 2012-04-20 ENCOUNTER — Encounter: Payer: Self-pay | Admitting: Adult Health

## 2012-04-20 VITALS — BP 118/66 | HR 86 | Temp 98.2°F | Ht 64.0 in | Wt 146.8 lb

## 2012-04-20 DIAGNOSIS — R918 Other nonspecific abnormal finding of lung field: Secondary | ICD-10-CM

## 2012-04-20 DIAGNOSIS — R042 Hemoptysis: Secondary | ICD-10-CM

## 2012-04-20 LAB — CBC WITH DIFFERENTIAL/PLATELET
Basophils Absolute: 0.1 10*3/uL (ref 0.0–0.1)
Basophils Relative: 1.2 % (ref 0.0–3.0)
Eosinophils Absolute: 0.2 10*3/uL (ref 0.0–0.7)
Hemoglobin: 10.2 g/dL — ABNORMAL LOW (ref 12.0–15.0)
Lymphocytes Relative: 32.7 % (ref 12.0–46.0)
Monocytes Relative: 8.2 % (ref 3.0–12.0)
Neutro Abs: 3.3 10*3/uL (ref 1.4–7.7)
Neutrophils Relative %: 54.4 % (ref 43.0–77.0)
RBC: 3.46 Mil/uL — ABNORMAL LOW (ref 3.87–5.11)
RDW: 14.3 % (ref 11.5–14.6)

## 2012-04-20 NOTE — Assessment & Plan Note (Addendum)
Progressive pulmonary hygiene s/p ENB w/ bx showing atypical cells  Case to be discussed at oncology /tumor conference on 2/27.  Had sign hemoptysis during ENB -that is improving w/ decreased freq/quantity since discharge  She is to remain off ASA until bleeding completely stops.  Check cbc today  cXR today w/ no sign change   Plan  May restart Breo 1 puff daily -brush/rinse/gargle after use.  I will call with labs  Continue off Aspirin  Wear Oxygen 2 l/m At bedtime  And As needed   follow up Dr. Marchelle Gearing in 2 weeks and As needed   Please contact office for sooner follow up if symptoms do not improve or worsen or seek emergency care  Call if develop bleeding.

## 2012-04-20 NOTE — Patient Instructions (Addendum)
May restart Breo 1 puff daily -brush/rinse/gargle after use.  I will call with labs  Continue off Aspirin  Wear Oxygen 2 l/m At bedtime  And As needed   follow up Dr. Marchelle Gearing in 2 weeks and As needed   Please contact office for sooner follow up if symptoms do not improve or worsen or seek emergency care  Call if develop bleeding.

## 2012-04-20 NOTE — Progress Notes (Signed)
Subjective:    Patient ID: Laurie [name], female    DOB: 1924/03/04, 79 y.o.   MRN: [id number]  HPI ARONSON,RICHARD A, MD is pcp. NP is BRittany Dabbs  Body mass index is 24.79 kg/(m^2).  reports that she has never smoked. She has never used smokeless tobacco.   IOV 12/26/2011  86 year ld  Widow since 2009. Here with daughter Laurie [name].  GRandaiughter is Laurie [name] 2100  She also has associaed cough for many years. Cough is mostly mild but periodically hard. QUality is  Dry cough . Will cough spontaneously and periodically cough is aggarvated by choking sensation during drinking water but happens only periodically. Cough is never for solids. Stable since onset.  RSI cough score is 20 and c/w irritable larynx/ LPR cooug And koffuman cough differntiator  Is 5 for both GERD and Neurogenic/Airway related cause of cough   c/o insdious onset of wheeze x few months. Aggravated by sleeping on left side and occ when she sits. Relieved by turning to right side. Severity is mild. Never tried inhalers for same. Denies associated dyspnea anytime, hemoptysis, edema (other than baseline), pnd, sinus drainage, orthopnea.  For the wheeze she had CT scan chest (done at [facility]) and she forgot to bring disc     She has associated occ GERd per her hx (she does not remember sept 2010 Barium study favoring obstruction).    LABS  Sept 2010  -<1. Partial obstruction distal esophagus of ingested 13 mm barium<BR>tablet without structurally obstructing lesion favoring probable 2. Slight induced gastroesophageal reflux. <3. Otherwise, negative.  CT May 2007  - Bilateral UL nodular L > R densities and apparently progressive compared to a 2005 CT chest  SEpt 2010  CXR  - LUL density   CT chest Oct 2013  - patient forgot to bring disc   REC  #Wheezing  - pleae have full PFt breathing test  - please bring your CD rom of ct chest  #Cough  Do cough score  #Followup  After pft  Bring cd rom ct  chest   OV 02/06/2012 FU wheezing and cough after PFT and CT chest  PFT - 02/06/2012  - spiro shows obstruction with positive BD Response, normal lung vlumes and mild low diffusion  - fev1 1.12L/74%, DVC 2.5L/75%, Ratio 69, Small airways - 39%. 15% BD response infev1, TLC 3.7/80%, RV 97%, DLCO 13/76%   CT chest from [facility]: Oct 11, 20123 - I see RLL superior segment and LUL posterior segment nodular consolidation bilaterally that appears fairly large with ill defined borders. There is not much significant mediastina/hilar nodes   Clearly the CT  Findings explain her symptoms and PFT. I then re-reviewed her hx. She and duaghter [name] forgot a lot of it but were able to recollect slowly. She has a CT in our system May 2007 where she has similar findings as current but much less prominent but even back then L was >r than right. The left lesion in May 2007 measured 4.5 x 3.3cm. IT appears she had had a CT in May 2005 at the then [facility] radiology and the findings in 2005 was one of even less prominence.   So overall CTs shows slow but steady grwoth since 2005 to 2013  She appears to have had bronch with TBBX on 03/03/2003 (? Site) and 10/31/2005 in lingular (? Dr Sherene Sires did both): non diagnostic bronch both times. Apparently after first one she bled a lot and is therefore fearful  of bronch   Past, Family, Social reviewed: no change since last visit    Possibilities are  - slow growing lung cancer called Bronchoalveolar lung cancer (biopsies can be "negative") and seen in non smoking women your age  - BOOP (local form)  - Acid reflux or chronic aspiration  - Sarcoidosis  - Related to autoimmune disease like lupus, wegner or rheumatoid arthritis  PLAN  - do blood test for autoimmune disease and sarcoid  - have to research about doing blood test for lung cancer antigen; Will let you know when and if we get it     OV 03/12/2012   Here to discuss results.    - oncimmune specific  antigen panel for lung cancer - negative - XPRESYS - lung cancer antigen test with negative predictive value - indeterminate - ACE borderline highg  - Autoimmune negative  - She presents with daugther. Cough still bothers her. Lack of etiology frustrating them.  No new issues   04/20/2012 [facility] follow up  Pt returns for a post hospital follow up  She has been undergoing evaluation for progressive bilateral lung infiltrates (initially seen 2005) . Workup has been nondx w/ previous FOB. PET showed hypermetabolism in the superior segment RLL.  Therefore she was set up for a Bronch w/ ENB , done on 2/17.  She had significant hemoptysis from the RLL -400cc  Pathology revealed atypical cells.  Case is being reviewed at oncology /tumor conference later this week for multidisciplinary discussion .  She was instructed to hold all NSAIDS, ASA and blood thinners .  Started on O2 at discharge.   Since discharge she is doing better.  Cough up dark blood-mixed with clots. Less each day.  No frank hemoptysis.  Remains off ASA .  cxr w/ no change in bilateral opacities . LLL infiltrate improved.  O2 on RA 95%  Walking sat without desats.     Past Medical History  Diagnosis Date  . Hypertension   . GERD (gastroesophageal reflux disease)   . Coronary artery disease     NONOBSTRUCTIVE WITH LVH AND PROMINENT THEBESIAN VEINS, CURRENTLY WITHOUT CHEST PAIN  . SOB (shortness of breath)     WITH WALKING UP STAIRS  . Incontinence   . Back pain   . Headache   . Asymmetric septal hypertrophy   . Mitral regurgitation   . Pulmonary hypertension   . TIA (transient ischemic attack)   . Syncope and collapse     HISTORY SYNCOPE  . History of vein stripping   . Esophagitis   . Other nonspecific abnormal finding of lung field      Family History  Problem Relation Age of Onset  . Heart disease Mother   . Prostate cancer Father   . Hypertension Father      History   Social History  .  Marital Status: Married    Spouse Name: N/A    Number of Children: N/A  . Years of Education: N/A   Occupational History  . Not on file.   Social History Main Topics  . Smoking status: Never Smoker   . Smokeless tobacco: Never Used  . Alcohol Use: No  . Drug Use: No  . Sexually Active: Not on file   Other Topics Concern  . Not on file   Social History Narrative  . No narrative on file     Allergies  Allergen Reactions  . Amoxicillin   . Aspirin     REACTION: intolerance  .  Lodine (Etodolac)      Outpatient Prescriptions Prior to Visit  Medication Sig Dispense Refill  . Acetaminophen (TYLENOL ARTHRITIS PAIN PO) Take by mouth as needed.        Marland Kitchen amLODipine (NORVASC) 2.5 MG tablet Take 1 tablet (2.5 mg total) by mouth daily.  90 tablet  1  . aspirin 81 MG tablet Take 81 mg by mouth daily.        Marland Kitchen atorvastatin (LIPITOR) 40 MG tablet Take 20 mg by mouth daily.      Marland Kitchen levothyroxine (SYNTHROID, LEVOTHROID) 75 MCG tablet Take 75 mcg by mouth daily.        Marland Kitchen losartan (COZAAR) 100 MG tablet Take 1 tablet (100 mg total) by mouth daily.  90 tablet  3  . metoprolol (TOPROL-XL) 100 MG 24 hr tablet Take 100 mg by mouth daily.        . Multiple Vitamin (MULTIVITAMIN) tablet Take 1 tablet by mouth daily. occasionally       . naproxen sodium (ANAPROX) 220 MG tablet Take 220 mg by mouth as needed.         Last reviewed on 03/12/2012  2:49 PM by Darrell Jewel, CMA   Review of Systems  Constitutional:   No  weight loss, night sweats,  Fevers, chills, fatigue, or  lassitude.  HEENT:   No headaches,  Difficulty swallowing,  Tooth/dental problems, or  Sore throat,                No sneezing, itching, ear ache, nasal congestion, post nasal drip,   CV:  No chest pain,  Orthopnea, PND, swelling in lower extremities, anasarca, dizziness, palpitations, syncope.   GI  No heartburn, indigestion, abdominal pain, nausea, vomiting, diarrhea, change in bowel habits, loss of appetite, bloody  stools.   RESP:   No change in color of mucus.  No wheezing.  No chest wall deformity  Skin: no rash or lesions.  GU: no dysuria, change in color of urine, no urgency or frequency.  No flank pain, no hematuria   MS:  No joint pain or swelling.  No decreased range of motion.  No back pain.  Psych:  No change in mood or affect. No depression or anxiety.  No memory loss.            Objective:   Physical Exam GEN: A/Ox3; pleasant , NAD, elderly   HEENT:  [place]/AT,  EACs-clear, TMs-wnl, NOSE-clear, THROAT-clear, no lesions, no postnasal drip or exudate noted.   NECK:  Supple w/ fair ROM; no JVD; normal carotid impulses w/o bruits; no thyromegaly or nodules palpated; no lymphadenopathy.  RESP  Diminished BS in bases  w/o, wheezes/ rales/ or rhonchi.no accessory muscle use, no dullness to percussion  CARD:  RRR, no m/r/g  , no peripheral edema, pulses intact, no cyanosis or clubbing.  GI:   Soft & nt; nml bowel sounds; no organomegaly or masses detected.  Musco: Warm bil, no deformities or joint swelling noted.   Neuro: alert, no focal deficits noted.    Skin: Warm, no lesions or rashes           Assessment & Plan:

## 2012-04-22 ENCOUNTER — Telehealth: Payer: Self-pay | Admitting: Adult Health

## 2012-04-22 NOTE — Telephone Encounter (Signed)
Pt scheduled to see MR on 04-27-12 at 4:30pm. Carron Curie, CMA

## 2012-04-26 ENCOUNTER — Telehealth: Payer: Self-pay | Admitting: Internal Medicine

## 2012-04-26 NOTE — Telephone Encounter (Signed)
Called and spoke with pt and she stated that she wanted to know when she could stop using the oxgyen.  i advised the pt that she will need to cont to use the oxygen as prescribed for now and keep her appt with MR on 3/12 to discuss this further.  Pt voiced her understanding of this and nothing further is needed.

## 2012-04-26 NOTE — Progress Notes (Signed)
Quick Note:  Called spoke with patient, advised of lab results / recs as stated by TP. Pt verbalized her understanding and denied any questions. ______ 

## 2012-04-27 ENCOUNTER — Ambulatory Visit: Payer: Medicare HMO | Admitting: Internal Medicine

## 2012-05-05 ENCOUNTER — Encounter: Payer: Self-pay | Admitting: Internal Medicine

## 2012-05-05 ENCOUNTER — Ambulatory Visit (INDEPENDENT_AMBULATORY_CARE_PROVIDER_SITE_OTHER): Payer: Medicare HMO | Admitting: Internal Medicine

## 2012-05-05 VITALS — BP 110/68 | HR 65 | Temp 97.8°F | Ht 64.0 in | Wt 150.8 lb

## 2012-05-05 DIAGNOSIS — R918 Other nonspecific abnormal finding of lung field: Secondary | ICD-10-CM

## 2012-05-05 DIAGNOSIS — R0902 Hypoxemia: Secondary | ICD-10-CM

## 2012-05-05 NOTE — Progress Notes (Signed)
Subjective:    Patient ID: Laurie [name], female    DOB: 30-Jan-1925, 79 y.o.   MRN: [id number]  HPI Laurie A, MD is pcp. NP is Laurie [name]  Body mass index is 24.79 kg/(m^2).  reports that she has never smoked. She has never used smokeless tobacco.   IOV 12/26/2011  79 year ld  Widow since 2009. Here with daughter Laurie [name].  GRandaiughter is Laurie [name] [address]100  She also has associaed cough for many years. Cough is mostly mild but periodically hard. QUality is  Dry cough . Will cough spontaneously and periodically cough is aggarvated by choking sensation during drinking water but happens only periodically. Cough is never for solids. Stable since onset.  RSI cough score is 20 and c/w irritable larynx/ LPR cooug And koffuman cough differntiator  Is 5 for both GERD and Neurogenic/Airway related cause of cough   c/o insdious onset of wheeze x few months. Aggravated by sleeping on left side and occ when she sits. Relieved by turning to right side. Severity is mild. Never tried inhalers for same. Denies associated dyspnea anytime, hemoptysis, edema (other than baseline), pnd, sinus drainage, orthopnea.  For the wheeze she had CT scan chest (done at [name]) and she forgot to bring disc     She has associated occ GERd per her hx (she does not remember sept 2010 Barium study favoring obstruction).    LABS  Sept 2010  -<1. Partial obstruction distal esophagus of ingested 13 mm barium<BR>tablet without structurally obstructing lesion favoring probable 2. Slight induced gastroesophageal reflux. <3. Otherwise, negative.  CT May 2007  - Bilateral UL nodular L > R densities and apparently progressive compared to [name] 2005 CT chest  SEpt 2010  CXR  - LUL density   CT chest Oct 2013  - patient forgot to bring disc   REC  #Wheezing  - pleae have full PFt breathing test  - please bring your CD rom of ct chest  #Cough  Do cough score  #Followup  After pft  Bring cd rom ct  chest   OV 02/06/2012 FU wheezing and cough after PFT and CT chest  PFT - 02/06/2012  - spiro shows obstruction with positive BD Response, normal lung vlumes and mild low diffusion  - fev1 1.12L/74%, DVC 2.5L/75%, Ratio 69, Small airways - 39%. 15% BD response infev1, TLC 3.7/80%, RV 97%, DLCO 13/76%   CT chest from [name]: Oct 11, 20123 - I see RLL superior segment and LUL posterior segment nodular consolidation bilaterally that appears fairly large with ill defined borders. There is not much significant mediastina/hilar nodes   Clearly the CT  Findings explain her symptoms and PFT. I then re-reviewed her hx. She and duaghter Laurie [name] forgot [name] lot of it but were able to recollect slowly. She has [name] CT in our system May 2007 where she has similar findings as current but much less prominent but even back then L was >r than right. The left lesion in May 2007 measured 4.5 x 3.3cm. IT appears she had had [name] CT in May 2005 at the then [name] and the findings in 2005 was one of even less prominence.   So overall CTs shows slow but steady grwoth since 2005 to 2013  She appears to have had bronch with TBBX on 03/03/2003 (? Site) and 10/31/2005 in lingular (? [name] did both): non diagnostic bronch both times. Apparently after first one she bled [name] lot and is therefore fearful  of bronch   Past, Family, Social reviewed: no change since last visit    Possibilities are  - slow growing lung cancer called Bronchoalveolar lung cancer (biopsies can be "negative") and seen in non smoking women your age  - BOOP (local form)  - Acid reflux or chronic aspiration  - Sarcoidosis  - Related to autoimmune disease like lupus, wegner or rheumatoid arthritis  PLAN  - do blood test for autoimmune disease and sarcoid  - have to research about doing blood test for lung cancer antigen; Will let you know when and if we get it     OV 03/12/2012   Here to discuss results.    - oncimmune specific  antigen panel for lung cancer - negative - XPRESYS - lung cancer antigen test with negative predictive value - indeterminate - ACE borderline highg  - Autoimmune negative  - She presents with daugther. Cough still bothers her. Lack of etiology frustrating them.  No new issues   04/20/2012 [name] follow up  Pt returns for [name] post [name] follow up  She has been undergoing evaluation for progressive bilateral lung infiltrates (initially seen 2005) . Workup has been nondx w/ previous FOB. PET showed hypermetabolism in the superior segment RLL.  Therefore she was set up for [name] Bronch w/ ENB , done on 2/17.  She had significant hemoptysis from the RLL -400cc  Pathology revealed atypical cells.  Case is being reviewed at oncology /tumor conference later this week for multidisciplinary discussion .  She was instructed to hold all NSAIDS, ASA and blood thinners .  Started on O2 at discharge.   Since discharge she is doing better.  Cough up dark blood-mixed with clots. Less each day.  No frank hemoptysis.  Remains off ASA .  cxr w/ no change in bilateral opacities . LLL infiltrate improved.  O2 on RA 95%  Walking sat without desats.   REC May restart Breo 1 puff daily -brush/rinse/gargle after use.  I will call with labs  Continue off Aspirin  Wear Oxygen 2 l/m At bedtime And As needed  follow up Laurie [name] in 2 weeks and As needed  Please contact office for sooner follow up if symptoms do not improve or worsen or seek emergency care  Call if develop bleeding.    OV 05/05/2012  - Returns for followup of bilateral upper lobe masslike densities that have been slowly progressive since 2005. Differential diagnosis is nonspecific inflammation not otherwise specified versus bronchoalveolar carcinoma. Electronic radiation bronchoscopy was complicated by massive hemoptysis. Biopsy shows only atypical cells. It is nondiagnostic.  - She returns with her daughter to discuss therapeutic  options. She still has mild persistent cough but this has not interfered with her quality of life and it is at an acceptable level. She feels that in her steroid has been helping alleviate the severity of cough. There are no new issues. We walked her on room air in the office 185 feet x3 laps and she did not desaturate. Daughter and she is expressing interest in [name] simplistic plan of care that focuses around symptom control. Associated with the cough she did denies any postnasal drainage. She only has occasional acid reflux. She uses TUMS once [name] week and is associated with food.  Review of Systems  Constitutional: Negative for fever and unexpected weight change.  HENT: Negative for ear pain, nosebleeds, congestion, sore throat, rhinorrhea, sneezing, trouble swallowing, dental problem, postnasal drip and sinus pressure.   Eyes: Negative for redness  and itching.  Respiratory: Positive for cough. Negative for chest tightness, shortness of breath and wheezing.   Cardiovascular: Negative for palpitations and leg swelling.  Gastrointestinal: Negative for nausea and vomiting.  Genitourinary: Negative for dysuria.  Musculoskeletal: Negative for joint swelling.  Skin: Negative for rash.  Neurological: Negative for headaches.  Hematological: Does not bruise/bleed easily.  Psychiatric/Behavioral: Negative for dysphoric mood. The patient is not nervous/anxious.        Objective:   Physical Exam GEN: [name]/Ox3; pleasant , NAD, elderly   HEENT:  [place]/AT,  EACs-clear, TMs-wnl, NOSE-clear, THROAT-clear, no lesions, no postnasal drip or exudate noted.   NECK:  Supple w/ fair ROM; no JVD; normal carotid impulses w/o bruits; no thyromegaly or nodules palpated; no lymphadenopathy.  RESP  Diminished BS in bases  w/o, wheezes/ rales/ or rhonchi.no accessory muscle use, no dullness to percussion  CARD:  RRR, no m/r/g  , no peripheral edema, pulses intact, no cyanosis or clubbing.  GI:   Soft & nt; nml bowel sounds;  no organomegaly or masses detected.  Musco: Warm bil, no deformities or joint swelling noted.   Neuro: alert, no focal deficits noted.    Skin: Warm, no lesions or rashes        Assessment & Plan:

## 2012-05-05 NOTE — Assessment & Plan Note (Signed)
These are of unclear etiology. Differential diagnosis is bronchoalveolar carcinoma versus chronic inflammation not otherwise specified. Autoimmune test is negative. We discussed empiric radiation but with the side effect of painful dysphagia she refused and she did not feel that it would be consistent with the goals of care. We discussed empiric prednisone but again the side effect profile for the relatively minor level of symptoms do not fit with the goals of care. So at this point Treated with inhaled corticosteroids Symbicort which seems to help with cough along with as needed acid reflux control and we'll see her in 6 months   Of note, there was an abnormality in the PET scan in the stomach region. When this was discussed with the multidisciplinary thoracic oncology morning conference 2 weeks ago it was felt that this might not be of significance. At this point patient wants to just observe this.   (> 50% of this 15 min visit spent in face to face counseling)

## 2012-05-05 NOTE — Patient Instructions (Addendum)
We agreed on conservative managment Stop breo Please start symbicort 80/4.5 2 puff twice daily - take sample, script and show technique USe prilosec OTC 20mg  as needed REturn in 6months Come sooner if cough worse

## 2012-05-10 LAB — FUNGUS CULTURE W SMEAR

## 2012-05-25 LAB — AFB CULTURE WITH SMEAR (NOT AT ARMC): Acid Fast Smear: NONE SEEN

## 2012-07-16 ENCOUNTER — Encounter: Payer: Self-pay | Admitting: Cardiology

## 2012-07-20 ENCOUNTER — Telehealth: Payer: Self-pay | Admitting: Internal Medicine

## 2012-07-20 ENCOUNTER — Encounter: Payer: Self-pay | Admitting: Cardiology

## 2012-07-20 MED ORDER — BUDESONIDE-FORMOTEROL FUMARATE 80-4.5 MCG/ACT IN AERO: 2.0000 | INHALATION_SPRAY | Freq: Two times a day (BID) | RESPIRATORY_TRACT | Status: DC

## 2012-07-20 NOTE — Telephone Encounter (Signed)
Rx has been sent in. Pt is aware. 

## 2012-11-12 ENCOUNTER — Ambulatory Visit (INDEPENDENT_AMBULATORY_CARE_PROVIDER_SITE_OTHER)
Admission: RE | Admit: 2012-11-12 | Discharge: 2012-11-12 | Disposition: A | Discharge status: Home / Self Care | Payer: Medicare HMO | Source: Ambulatory Visit | Attending: Internal Medicine | Admitting: Internal Medicine

## 2012-11-12 ENCOUNTER — Ambulatory Visit (INDEPENDENT_AMBULATORY_CARE_PROVIDER_SITE_OTHER): Payer: Medicare HMO | Admitting: Internal Medicine

## 2012-11-12 ENCOUNTER — Encounter: Payer: Self-pay | Admitting: Internal Medicine

## 2012-11-12 VITALS — BP 128/84 | HR 82 | Temp 98.1°F | Ht 64.0 in | Wt 151.0 lb

## 2012-11-12 DIAGNOSIS — R05 Cough: Secondary | ICD-10-CM

## 2012-11-12 DIAGNOSIS — R0789 Other chest pain: Secondary | ICD-10-CM

## 2012-11-12 DIAGNOSIS — R918 Other nonspecific abnormal finding of lung field: Secondary | ICD-10-CM

## 2012-11-12 MED ORDER — LEVOFLOXACIN 500 MG PO TABS: 500.0000 mg | ORAL_TABLET | Freq: Every day | ORAL | Status: DC

## 2012-11-12 MED ORDER — TRAMADOL HCL 50 MG PO TABS: 50.0000 mg | ORAL_TABLET | Freq: Three times a day (TID) | ORAL | Status: DC | PRN

## 2012-11-12 NOTE — Progress Notes (Signed)
Subjective:    Patient ID: Laurie [name], female    DOB: 21-May-1924, 79 y.o.   MRN: [id number]  HPI  Followup of bilateral upper lobe masslike densities that have been slowly progressive since 2005. Differential diagnosis is nonspecific inflammation not otherwise specified versus bronchoalveolar carcinoma. Electronic radiation bronchoscopy was complicated by massive hemoptysis. Biopsy shows only atypical cells. It is nondiagnostic FEb 2014. Autoimmune negative  OV 11/12/2012  6 month followup for the above. Overall she is doing well. She presents with her daughter. She has been gaining weight. For the last few weeks she's had increasing cough with new yellow sputum that is mild in amount but definite change from baseline. Her appetite is good there is no fever. For the past few weeks after the cough started she is also noticed a dull nonspecific, constant, aching pain in the left interscapular region higher up. She did see her primary care physician Dr. Jacky Kindle for the same and was given an intramuscular injection of steroid according to her history but this has not helped. There is no fever or edema or hemoptysis or wheezing  Medical history reviewed in this note change since last visit in March 2014   CXR 11/12/12  - stabble bilater UL mass like densities; stable since earlier this year (personally reviewd image)    Review of Systems  Constitutional: Negative for fever and unexpected weight change.  HENT: Negative for ear pain, nosebleeds, congestion, sore throat, rhinorrhea, sneezing, trouble swallowing, dental problem, postnasal drip and sinus pressure.   Eyes: Negative for redness and itching.  Respiratory: Positive for wheezing. Negative for cough, chest tightness and shortness of breath.   Cardiovascular: Positive for chest pain. Negative for palpitations and leg swelling.  Gastrointestinal: Negative for nausea and vomiting.  Genitourinary: Negative for dysuria.  Musculoskeletal:  Negative for joint swelling.  Skin: Negative for rash.  Neurological: Negative for headaches.  Hematological: Does not bruise/bleed easily.  Psychiatric/Behavioral: Negative for dysphoric mood. The patient is not nervous/anxious.        Objective:   Physical Exam  Vitals reviewed. Constitutional: She is oriented to person, place, and time. She appears well-developed and well-nourished. No distress.  HENT:  Head: Normocephalic and atraumatic.  Right Ear: External ear normal.  Left Ear: External ear normal.  Mouth/Throat: Oropharynx is clear and moist. No oropharyngeal exudate.  Eyes: Conjunctivae and EOM are normal. Pupils are equal, round, and reactive to light. Right eye exhibits no discharge. Left eye exhibits no discharge. No scleral icterus.  Neck: Normal range of motion. Neck supple. No JVD present. No tracheal deviation present. No thyromegaly present.  Cardiovascular: Normal rate, regular rhythm, normal heart sounds and intact distal pulses.  Exam reveals no gallop and no friction rub.   No murmur heard. Pulmonary/Chest: Effort normal and breath sounds normal. No respiratory distress. She has no wheezes. She has no rales. She exhibits no tenderness.  Abdominal: Soft. Bowel sounds are normal. She exhibits no distension and no mass. There is no tenderness. There is no rebound and no guarding.  Musculoskeletal: Normal range of motion. She exhibits no edema and no tenderness.  Lymphadenopathy:    She has no cervical adenopathy.  Neurological: She is alert and oriented to person, place, and time. She has normal reflexes. No cranial nerve deficit. She exhibits normal muscle tone. Coordination normal.  Skin: Skin is warm and dry. No rash noted. She is not diaphoretic. No erythema. No pallor.  Psychiatric: She has a normal mood and affect. Her behavior  is normal. Judgment and thought content normal.          Assessment & Plan:

## 2012-11-12 NOTE — Patient Instructions (Addendum)
#  Left Upper BAck Chest tenderness  -could be due to bronchitis -  take levaquin 500mg  once daily  X 5 days - continue tylenol as needed for pain - try ultram 50mg  three times daily as needed x 7 days - do CXR today; will inform you of results  #FOllowup 6 months or soonr if needed If pain does not resolve in few weeks or gets worse, call us sooner or come sooner

## 2012-11-14 ENCOUNTER — Telehealth: Payer: Self-pay | Admitting: Internal Medicine

## 2012-11-14 DIAGNOSIS — R05 Cough: Secondary | ICD-10-CM | POA: Insufficient documentation

## 2012-11-14 DIAGNOSIS — R0789 Other chest pain: Secondary | ICD-10-CM | POA: Insufficient documentation

## 2012-11-14 NOTE — Assessment & Plan Note (Signed)
This is on left upper back with rib tenderness  PLAN #Left Upper BAck Chest tenderness  -could be due to bronchitis -  take levaquin 500mg  once daily  X 5 days - continue tylenol as needed for pain - try ultram 50mg  three times daily as needed x 7 days   #FOllowup 6 months or soonr if needed If pain does not resolve in few weeks or gets worse, call us sooner or come sooner

## 2012-11-14 NOTE — Telephone Encounter (Signed)
Let thme know cxr 11/12/12 is stable bilateral UL density compared to Spring 2014

## 2012-11-14 NOTE — Assessment & Plan Note (Signed)
Stable on CXR compared to spiring 2014. Contnued expectant fu only

## 2012-11-19 NOTE — Telephone Encounter (Signed)
[email or web address].Marlise Fahr, CMA  

## 2012-11-22 NOTE — Telephone Encounter (Signed)
I spoke with patient about results and she verbalized understanding and had no questions 

## 2012-11-22 NOTE — Telephone Encounter (Signed)
Returning call can be reached at [phone].Laurie [name]

## 2012-12-30 ENCOUNTER — Other Ambulatory Visit: Payer: Self-pay

## 2013-03-24 ENCOUNTER — Telehealth: Payer: Self-pay | Admitting: Internal Medicine

## 2013-03-24 MED ORDER — BUDESONIDE-FORMOTEROL FUMARATE 80-4.5 MCG/ACT IN AERO: 2.0000 | INHALATION_SPRAY | Freq: Two times a day (BID) | RESPIRATORY_TRACT | Status: AC

## 2013-03-24 NOTE — Telephone Encounter (Signed)
Called and spoke with pt. She needed a refill on her symbicort. I have sent this in. Nothing further needed

## 2013-04-06 ENCOUNTER — Telehealth: Payer: Self-pay | Admitting: Internal Medicine

## 2013-04-06 NOTE — Telephone Encounter (Signed)
I spoke with the pt and she is c/o having a productive cough, chest tightness, and wheezing. She also states that she coughed up a small amount of blood a few days ago. I advised the pt that she needs an appt. Pt cannot come in until Friday. Appt set for Friday at 10:30am with [place], CMA

## 2013-04-08 ENCOUNTER — Encounter: Payer: Self-pay | Admitting: Internal Medicine

## 2013-04-08 ENCOUNTER — Ambulatory Visit (INDEPENDENT_AMBULATORY_CARE_PROVIDER_SITE_OTHER)
Admission: RE | Admit: 2013-04-08 | Discharge: 2013-04-08 | Disposition: A | Discharge status: Home / Self Care | Payer: Medicare HMO | Source: Ambulatory Visit | Attending: Internal Medicine | Admitting: Internal Medicine

## 2013-04-08 ENCOUNTER — Ambulatory Visit (INDEPENDENT_AMBULATORY_CARE_PROVIDER_SITE_OTHER): Payer: Medicare HMO | Admitting: Internal Medicine

## 2013-04-08 VITALS — BP 108/76 | HR 76 | Temp 97.7°F | Ht 64.0 in | Wt 153.0 lb

## 2013-04-08 DIAGNOSIS — J479 Bronchiectasis, uncomplicated: Secondary | ICD-10-CM

## 2013-04-08 DIAGNOSIS — R05 Cough: Secondary | ICD-10-CM

## 2013-04-08 DIAGNOSIS — R059 Cough, unspecified: Secondary | ICD-10-CM

## 2013-04-08 MED ORDER — TRAMADOL HCL 50 MG PO TABS: ORAL_TABLET | ORAL | Status: DC

## 2013-04-08 MED ORDER — LEVOFLOXACIN 500 MG PO TABS: 500.0000 mg | ORAL_TABLET | Freq: Every day | ORAL | Status: DC

## 2013-04-08 NOTE — Progress Notes (Addendum)
Subjective:    Patient ID: Laurie [name], female    DOB: 12-May-1924, 80 y.o.   MRN: [id number]  HPI  Followup of bilateral upper lobe masslike densities that have been slowly progressive since 2005. Differential diagnosis is nonspecific inflammation not otherwise specified versus bronchoalveolar carcinoma. Electronic radiation bronchoscopy was complicated by massive hemoptysis. Biopsy shows only atypical cells. It is nondiagnostic FEb 2014. Autoimmune negative  OV 11/12/2012  6 month followup for the above. Overall she is doing well. She presents with her daughter. She has been gaining weight. For the last few weeks she's had increasing cough with new yellow sputum that is mild in amount but definite change from baseline. Her appetite is good there is no fever. For the past few weeks after the cough started she is also noticed a dull nonspecific, constant, aching pain in the left interscapular region higher up. She did see her primary care physician Dr. Reynaldo Minium for the same and was given an intramuscular injection of steroid according to her history but this has not helped. There is no fever or edema or hemoptysis or wheezing #Left Upper BAck Chest tenderness  -could be due to bronchitis -  take levaquin 500mg  once daily  X 5 days - continue tylenol as needed for pain - try ultram 50mg  three times daily as needed x 7 days - do CXR today; will inform you of results  #FOllowup 6 months or soonr if needed If pain does not resolve in few weeks or gets worse, call us sooner or come sooner     04/08/2013  Acute ov/Brilyn Tuller Never smoker re: hemoptysis ? Bronchiectasis (neg w/u for lung masses noted) - last seen by me 2009 with rec for yearly cxr's only based on neg w/u in 2005 Chief Complaint  Patient presents with  . Acute Visit    Presents today with chest tightness and coughing x1 week. No mucus production. Did cough up blood one time but has not since then.  discomfort r side of midline with  cough but no fever or yellow mucus Cough much worse with mint use   No obvious day to day or daytime variabilty or assoc subjective wheeze overt sinus or hb symptoms. No unusual exp hx or h/o childhood pna/ asthma or knowledge of premature birth.  Sleeping ok without nocturnal  or early am exacerbation  of respiratory  c/o's or need for noct saba. Also denies any obvious fluctuation of symptoms with weather or environmental changes or other aggravating or alleviating factors except as outlined above   Current Medications, Allergies, Complete Past Medical History, Past Surgical History, Family History, and Social History were reviewed in [employer] record.  ROS  The following are not active complaints unless bolded sore throat, dysphagia, dental problems, itching, sneezing,  nasal congestion or excess/ purulent secretions, ear ache,   fever, chills, sweats, unintended wt loss, pleuritic cp or exertional cp, hemoptysis,  orthopnea pnd or leg swelling, presyncope, palpitations, heartburn, abdominal pain, anorexia, nausea, vomiting, diarrhea  or change in bowel or urinary habits, change in stools or urine, dysuria,hematuria,  rash, arthralgias, visual complaints, headache, numbness weakness or ataxia or problems with walking or coordination,  change in mood/affect or memory.        Past Medical History:  Hypothyroidism  Hyperlipidemia  BRONCHIECTASIS................................................................................Marland KitchenWert  - F0B neg 03/03/2003  - CT 06/2005 worse  - Re FOB 10/2005 neg          Objective:   Physical Exam  Wt Readings from Last 3 Encounters:  04/08/13 153 lb (69.4 kg)  11/12/12 151 lb (68.493 kg)  05/05/12 150 lb 12.8 oz (68.402 kg)     HEENT: nl dentition, turbinates, and orophanx. Nl external ear canals without cough reflex   NECK :  without JVD/Nodes/TM/ nl carotid upstrokes bilaterally   LUNGS: no acc muscle use, clear to A and  P bilaterally with very min rhonchi bilaterally, no wheeze   CV:  RRR  no s3 or murmur or increase in P2, no edema   ABD:  soft and nontender with nl excursion in the supine position. No bruits or organomegaly, bowel sounds nl  MS:  warm without deformities, calf tenderness, cyanosis or clubbing  SKIN: warm and dry without lesions    NEURO:  alert, approp, no deficits     CXR  04/08/2013 :  Stable multifocal bilateral densities without evidence of new focal regions of consolidation or new focal infiltrates.     Assessment & Plan:

## 2013-04-08 NOTE — Patient Instructions (Addendum)
Take delsym two tsp every 12 hours and supplement if needed with  tramadol 50 mg up to 1 every 4 hours to suppress the urge to cough. Swallowing water or using ice chips/non mint and menthol containing candies (such as lifesavers or sugarless jolly ranchers) are also effective.  You should rest your voice and avoid activities that you know make you cough.  Once you have eliminated the cough for 3 straight days try reducing the tramadol first,  then the delsym as tolerated.    Please remember to go to the x-ray department downstairs for your tests - we will call you with the results when they are available.  Levaquin 500 mg one daily x 7 days   GERD (REFLUX)  is an extremely common cause of respiratory symptoms, many times with no significant heartburn at all.    It can be treated with medication, but also with lifestyle changes including avoidance of late meals, excessive alcohol, smoking cessation, and avoid fatty foods, chocolate, peppermint, colas, red wine, and acidic juices such as orange juice.  NO MINT OR MENTHOL PRODUCTS SO NO COUGH DROPS  USE SUGARLESS CANDY INSTEAD (jolley ranchers or Stover's)  NO OIL BASED VITAMINS - use powdered substitutes.

## 2013-04-08 NOTE — Progress Notes (Signed)
Quick Note:  Spoke with pt and notified of results per Dr. Wert. Pt verbalized understanding and denied any questions.  ______ 

## 2013-04-09 NOTE — Assessment & Plan Note (Addendum)
06/30/2005 CT chest  -There has been progression in size of densities in the upper lobes bilaterally. Within these densities, there is evidence of bronchiectasis. There are satellite densities also present bilaterally with the satellite density larger in the superior segment of the left lower lobe but unchanged on the right. Given the bronchiectasis, these findings may be due to scarring surrounding the bronchiectasis. - PFT's 02/06/12 FEV1  1.21 (74%) ratio 69 and 15% better p B2 Acute flare with discolored mucus and recurrent cp in same distribution as previous episodes of CP > rx levaquin   Pt not inclined to further w/u for a problem that goes back almost 10 years to when I previously eval her with no evidence of a treatable ca nor of atypical TB.      Each maintenance medication was reviewed in detail including most importantly the difference between maintenance and as needed and under what circumstances the prns are to be used.  Please see instructions for details which were reviewed in writing and the patient given a copy.

## 2013-04-09 NOTE — Assessment & Plan Note (Signed)
Much worse with mint exposure suggests component of GERD > diet reviewed

## 2013-07-15 ENCOUNTER — Encounter: Payer: Self-pay | Admitting: Internal Medicine

## 2013-07-15 ENCOUNTER — Ambulatory Visit (INDEPENDENT_AMBULATORY_CARE_PROVIDER_SITE_OTHER): Payer: Commercial Managed Care - HMO | Admitting: Internal Medicine

## 2013-07-15 VITALS — BP 142/68 | HR 67 | Ht 64.0 in | Wt 155.4 lb

## 2013-07-15 DIAGNOSIS — R918 Other nonspecific abnormal finding of lung field: Secondary | ICD-10-CM

## 2013-07-15 DIAGNOSIS — J479 Bronchiectasis, uncomplicated: Secondary | ICD-10-CM

## 2013-07-15 NOTE — Patient Instructions (Signed)
#  Bronchiectasis  - stable - no action  #Dizziness and fatigue  - this is not due to oxygen issue  - please discuss with ARONSON,RICHARD A, MD  #Follouwp  1 year or as needed; which ever you like 

## 2013-07-15 NOTE — Progress Notes (Signed)
Subjective:    Patient ID: Laurie [name], female    DOB: 1924/12/20, 80 y.o.   MRN: [id number]  HPI   Followup of bilateral upper lobe masslike densities that have been slowly progressive since 2005. Differential diagnosis is nonspecific inflammation not otherwise specified versus bronchoalveolar carcinoma. Electronic radiation bronchoscopy was complicated by massive hemoptysis. Biopsy shows only atypical cells. It is nondiagnostic FEb 2014. Autoimmune negative  OV 11/12/2012  6 month followup for the above. Overall she is doing well. She presents with her daughter. She has been gaining weight. For the last few weeks she's had increasing cough with new yellow sputum that is mild in amount but definite change from baseline. Her appetite is good there is no fever. For the past few weeks after the cough started she is also noticed a dull nonspecific, constant, aching pain in the left interscapular region higher up. She did see her primary care physician Dr. Reynaldo Minium for the same and was given an intramuscular injection of steroid according to her history but this has not helped. There is no fever or edema or hemoptysis or wheezing #Left Upper BAck Chest tenderness  -could be due to bronchitis -  take levaquin 500mg  once daily  X 5 days - continue tylenol as needed for pain - try ultram 50mg  three times daily as needed x 7 days - do CXR today; will inform you of results  #FOllowup 6 months or soonr if needed If pain does not resolve in few weeks or gets worse, call us sooner or come sooner     04/08/2013  Acute ov/Wert Never smoker re: hemoptysis ? Bronchiectasis (neg w/u for lung masses noted) - last seen by me 2009 with rec for yearly cxr's only based on neg w/u in 2005 Chief Complaint  Patient presents with  . Acute Visit    Presents today with chest tightness and coughing x1 week. No mucus production. Did cough up blood one time but has not since then.  discomfort r side of midline with  cough but no fever or yellow mucus Cough much worse with mint use   No obvious day to day or daytime variabilty or assoc subjective wheeze overt sinus or hb symptoms. No unusual exp hx or h/o childhood pna/ asthma or knowledge of premature birth.  Sleeping ok without nocturnal  or early am exacerbation  of respiratory  c/o's or need for noct saba. Also denies any obvious fluctuation of symptoms with weather or environmental changes or other aggravating or alleviating factors except as outlined above        OV 07/15/2013  Chief Complaint  Patient presents with  . Follow-up    Pt states breathing has worsened since last visit. Pt c/o wheezing, SOB with activity, intermittent CP with and without activity.   FU Bilateral UL densities/bronchietasis - that are idiopathic after extensive investigatigation (bronch resulting in life threatening bleed)  - Here with daughter. DEnies complaints resp wise. MAin issue is fatigue and orthostatic dizziness; not fully addressed with PCP yet per daughter.  She did not desaturate with exertion thus making pulmonary cause for these unlikely. Denies dyspnea or cough other than mild and is not worse.  GRandaughter wedding in October 2015   CXR 04/08/13: stable UL densitiies/bronchiectasis  Walk test 07/15/2013 : did not desaturate    Past Medical History:  Hypothyroidism  Hyperlipidemia  BRONCHIECTASIS................................................................................Marland KitchenWert  - F0B neg 03/03/2003  - CT 06/2005 worse  - Re FOB 10/2005 neg  Review of Systems  Constitutional: Negative for fever and unexpected weight change.  HENT: Positive for congestion and rhinorrhea. Negative for dental problem, ear pain, nosebleeds, postnasal drip, sinus pressure, sneezing, sore throat and trouble swallowing.   Eyes: Negative for redness and itching.  Respiratory: Positive for cough and shortness of breath. Negative for chest tightness and wheezing.     Cardiovascular: Positive for chest pain. Negative for palpitations and leg swelling.  Gastrointestinal: Positive for vomiting. Negative for nausea.  Genitourinary: Negative for dysuria.  Musculoskeletal: Negative for joint swelling.  Skin: Negative for rash.  Neurological: Positive for headaches.  Hematological: Does not bruise/bleed easily.  Psychiatric/Behavioral: Negative for dysphoric mood. The patient is not nervous/anxious.        Objective:   Physical Exam  Filed Vitals:   07/15/13 1028  BP: 142/68  Pulse: 67  Height: 5\' 4"  (1.626 m)  Weight: 155 lb 6.4 oz (70.489 kg)  SpO2: 96%     HEENT: nl dentition, turbinates, and orophanx. Nl external ear canals without cough reflex   NECK :  without JVD/Nodes/TM/ nl carotid upstrokes bilaterally   LUNGS: no acc muscle use, clear to A and P bilaterally with very min rhonchi bilaterally, no wheeze   CV:  RRR  no s3 or murmur or increase in P2, no edema   ABD:  soft and nontender with nl excursion in the supine position. No bruits or organomegaly, bowel sounds nl  MS:  warm without deformities, calf tenderness, cyanosis or clubbing  SKIN: warm and dry without lesions    NEURO:  alert, approp, no deficits     CXR  04/08/2013 :  Stable multifocal bilateral densities without evidence of new focal regions of consolidation or new focal infiltrates.      Assessment & Plan:

## 2013-07-17 NOTE — Assessment & Plan Note (Signed)
#  Bronchiectasis  - stable - no action  #Dizziness and fatigue  - this is not due to oxygen issue  - please discuss with ARONSON,RICHARD A, MD  #Follouwp  1 year or as needed; which ever you like

## 2013-09-16 ENCOUNTER — Other Ambulatory Visit: Payer: Self-pay | Admitting: Internal Medicine

## 2013-09-16 DIAGNOSIS — R42 Dizziness and giddiness: Secondary | ICD-10-CM

## 2013-09-16 DIAGNOSIS — R51 Headache: Secondary | ICD-10-CM

## 2013-09-19 ENCOUNTER — Ambulatory Visit
Admission: RE | Admit: 2013-09-19 | Discharge: 2013-09-19 | Disposition: A | Discharge status: Home / Self Care | Payer: Commercial Managed Care - HMO | Source: Ambulatory Visit | Attending: Internal Medicine | Admitting: Internal Medicine

## 2013-09-19 DIAGNOSIS — R51 Headache: Secondary | ICD-10-CM

## 2013-09-19 DIAGNOSIS — R42 Dizziness and giddiness: Secondary | ICD-10-CM

## 2014-12-02 ENCOUNTER — Emergency Department (HOSPITAL_COMMUNITY): Payer: Commercial Managed Care - HMO

## 2014-12-02 ENCOUNTER — Encounter (HOSPITAL_COMMUNITY): Payer: Self-pay | Admitting: *Deleted

## 2014-12-02 ENCOUNTER — Inpatient Hospital Stay (HOSPITAL_COMMUNITY)
Admission: EM | Admit: 2014-12-02 | Discharge: 2014-12-06 | DRG: 065 | Disposition: A | Discharge status: Skilled Nursing Facility | Payer: Commercial Managed Care - HMO | Attending: Internal Medicine | Admitting: Internal Medicine

## 2014-12-02 DIAGNOSIS — I639 Cerebral infarction, unspecified: Secondary | ICD-10-CM | POA: Diagnosis not present

## 2014-12-02 DIAGNOSIS — I6340 Cerebral infarction due to embolism of unspecified cerebral artery: Secondary | ICD-10-CM | POA: Diagnosis not present

## 2014-12-02 DIAGNOSIS — I6349 Cerebral infarction due to embolism of other cerebral artery: Secondary | ICD-10-CM | POA: Diagnosis not present

## 2014-12-02 DIAGNOSIS — Z8673 Personal history of transient ischemic attack (TIA), and cerebral infarction without residual deficits: Secondary | ICD-10-CM | POA: Diagnosis not present

## 2014-12-02 DIAGNOSIS — M6282 Rhabdomyolysis: Secondary | ICD-10-CM | POA: Diagnosis present

## 2014-12-02 DIAGNOSIS — R42 Dizziness and giddiness: Secondary | ICD-10-CM | POA: Diagnosis not present

## 2014-12-02 DIAGNOSIS — Z7982 Long term (current) use of aspirin: Secondary | ICD-10-CM | POA: Diagnosis not present

## 2014-12-02 DIAGNOSIS — R410 Disorientation, unspecified: Secondary | ICD-10-CM | POA: Diagnosis not present

## 2014-12-02 DIAGNOSIS — I10 Essential (primary) hypertension: Secondary | ICD-10-CM | POA: Insufficient documentation

## 2014-12-02 DIAGNOSIS — Z66 Do not resuscitate: Secondary | ICD-10-CM | POA: Diagnosis present

## 2014-12-02 DIAGNOSIS — I272 Other secondary pulmonary hypertension: Secondary | ICD-10-CM | POA: Diagnosis present

## 2014-12-02 DIAGNOSIS — Z79899 Other long term (current) drug therapy: Secondary | ICD-10-CM

## 2014-12-02 DIAGNOSIS — E785 Hyperlipidemia, unspecified: Secondary | ICD-10-CM | POA: Diagnosis not present

## 2014-12-02 DIAGNOSIS — Z7951 Long term (current) use of inhaled steroids: Secondary | ICD-10-CM | POA: Diagnosis not present

## 2014-12-02 DIAGNOSIS — I5032 Chronic diastolic (congestive) heart failure: Secondary | ICD-10-CM | POA: Diagnosis present

## 2014-12-02 DIAGNOSIS — R54 Age-related physical debility: Secondary | ICD-10-CM | POA: Diagnosis present

## 2014-12-02 DIAGNOSIS — F0390 Unspecified dementia without behavioral disturbance: Secondary | ICD-10-CM | POA: Diagnosis present

## 2014-12-02 DIAGNOSIS — I340 Nonrheumatic mitral (valve) insufficiency: Secondary | ICD-10-CM | POA: Diagnosis not present

## 2014-12-02 DIAGNOSIS — I110 Hypertensive heart disease with heart failure: Secondary | ICD-10-CM | POA: Diagnosis present

## 2014-12-02 DIAGNOSIS — I6789 Other cerebrovascular disease: Secondary | ICD-10-CM | POA: Diagnosis not present

## 2014-12-02 DIAGNOSIS — I2510 Atherosclerotic heart disease of native coronary artery without angina pectoris: Secondary | ICD-10-CM | POA: Diagnosis present

## 2014-12-02 DIAGNOSIS — E876 Hypokalemia: Secondary | ICD-10-CM | POA: Diagnosis present

## 2014-12-02 DIAGNOSIS — E039 Hypothyroidism, unspecified: Secondary | ICD-10-CM | POA: Diagnosis present

## 2014-12-02 DIAGNOSIS — I63131 Cerebral infarction due to embolism of right carotid artery: Secondary | ICD-10-CM | POA: Diagnosis not present

## 2014-12-02 LAB — COMPREHENSIVE METABOLIC PANEL
ALBUMIN: 2.2 g/dL — AB (ref 3.5–5.0)
ALT: 13 U/L — AB (ref 14–54)
AST: 28 U/L (ref 15–41)
Alkaline Phosphatase: 80 U/L (ref 38–126)
Anion gap: 10 (ref 5–15)
BUN: 8 mg/dL (ref 6–20)
CHLORIDE: 98 mmol/L — AB (ref 101–111)
CO2: 28 mmol/L (ref 22–32)
CREATININE: 0.97 mg/dL (ref 0.44–1.00)
Calcium: 8.4 mg/dL — ABNORMAL LOW (ref 8.9–10.3)
GFR calc Af Amer: 59 mL/min — ABNORMAL LOW (ref >60)
GFR calc non Af Amer: 51 mL/min — ABNORMAL LOW (ref >60)
GLUCOSE: 134 mg/dL — AB (ref 65–99)
Potassium: 3.2 mmol/L — ABNORMAL LOW (ref 3.5–5.1)
SODIUM: 136 mmol/L (ref 135–145)
Total Bilirubin: 0.7 mg/dL (ref 0.3–1.2)
Total Protein: 6.4 g/dL — ABNORMAL LOW (ref 6.5–8.1)

## 2014-12-02 LAB — CBC WITH DIFFERENTIAL/PLATELET
Basophils Absolute: 0.1 K/uL (ref 0.0–0.1)
Basophils Relative: 1 %
Eosinophils Absolute: 0 K/uL (ref 0.0–0.7)
Eosinophils Relative: 0 %
HCT: 38.2 % (ref 36.0–46.0)
Hemoglobin: 12 g/dL (ref 12.0–15.0)
Lymphocytes Relative: 7 %
Lymphs Abs: 0.7 K/uL (ref 0.7–4.0)
MCH: 27.2 pg (ref 26.0–34.0)
MCHC: 31.4 g/dL (ref 30.0–36.0)
MCV: 86.6 fL (ref 78.0–100.0)
Monocytes Absolute: 0.6 K/uL (ref 0.1–1.0)
Monocytes Relative: 6 %
Neutro Abs: 8.5 K/uL — ABNORMAL HIGH (ref 1.7–7.7)
Neutrophils Relative %: 86 %
Platelets: 246 K/uL (ref 150–400)
RBC: 4.41 MIL/uL (ref 3.87–5.11)
RDW: 15 % (ref 11.5–15.5)
WBC: 9.9 K/uL (ref 4.0–10.5)

## 2014-12-02 LAB — URINALYSIS, ROUTINE W REFLEX MICROSCOPIC
Bilirubin Urine: NEGATIVE
Glucose, UA: NEGATIVE mg/dL
Ketones, ur: NEGATIVE mg/dL
Leukocytes, UA: NEGATIVE
Nitrite: NEGATIVE
Protein, ur: NEGATIVE mg/dL
Specific Gravity, Urine: 1.01 (ref 1.005–1.030)
Urobilinogen, UA: 1 mg/dL (ref 0.0–1.0)
pH: 7.5 (ref 5.0–8.0)

## 2014-12-02 LAB — PROTIME-INR
INR: 1.19 (ref 0.00–1.49)
Prothrombin Time: 15.3 s — ABNORMAL HIGH (ref 11.6–15.2)

## 2014-12-02 LAB — CK TOTAL AND CKMB (NOT AT ARMC)
CK TOTAL: 780 U/L — AB (ref 38–234)
CK, MB: 4.7 ng/mL (ref 0.5–5.0)
RELATIVE INDEX: 0.6 (ref 0.0–2.5)

## 2014-12-02 LAB — URINE MICROSCOPIC-ADD ON

## 2014-12-02 LAB — APTT: aPTT: 33 s (ref 24–37)

## 2014-12-02 LAB — I-STAT TROPONIN, ED: Troponin i, poc: 0 ng/mL (ref 0.00–0.08)

## 2014-12-02 LAB — TSH: TSH: 0.575 u[IU]/mL (ref 0.350–4.500)

## 2014-12-02 MED ORDER — LABETALOL HCL 5 MG/ML IV SOLN: 10.0000 mg | INTRAVENOUS | Status: DC | PRN

## 2014-12-02 MED ORDER — LORAZEPAM 2 MG/ML IJ SOLN
1.0000 mg | Freq: Once | INTRAMUSCULAR | Status: AC
  Administered 2014-12-02: 1 mg via INTRAVENOUS
  Filled 2014-12-02: qty 1

## 2014-12-02 MED ORDER — STROKE: EARLY STAGES OF RECOVERY BOOK: Freq: Once | Status: DC

## 2014-12-02 MED ORDER — ALBUTEROL SULFATE (2.5 MG/3ML) 0.083% IN NEBU: 2.5000 mg | INHALATION_SOLUTION | Freq: Four times a day (QID) | RESPIRATORY_TRACT | Status: DC | PRN

## 2014-12-02 MED ORDER — SODIUM CHLORIDE 0.9 % IV SOLN
INTRAVENOUS | Status: DC
  Administered 2014-12-02: 22:00:00 via INTRAVENOUS

## 2014-12-02 MED ORDER — ENOXAPARIN SODIUM 40 MG/0.4ML ~~LOC~~ SOLN
40.0000 mg | SUBCUTANEOUS | Status: DC
  Administered 2014-12-02 – 2014-12-05 (×4): 40 mg via SUBCUTANEOUS
  Filled 2014-12-02 (×4): qty 0.4

## 2014-12-02 MED ORDER — ATORVASTATIN CALCIUM 10 MG PO TABS
20.0000 mg | ORAL_TABLET | Freq: Every day | ORAL | Status: DC
  Administered 2014-12-02 – 2014-12-06 (×5): 20 mg via ORAL
  Filled 2014-12-02 (×5): qty 2

## 2014-12-02 MED ORDER — ASPIRIN 300 MG RE SUPP: 300.0000 mg | Freq: Every day | RECTAL | Status: DC

## 2014-12-02 MED ORDER — ASPIRIN 325 MG PO TABS
325.0000 mg | ORAL_TABLET | Freq: Every day | ORAL | Status: DC
Start: 2014-12-02 — End: 2014-12-06
  Administered 2014-12-02 – 2014-12-06 (×5): 325 mg via ORAL
  Filled 2014-12-02 (×5): qty 1

## 2014-12-02 MED ORDER — ACETAMINOPHEN 650 MG RE SUPP: 650.0000 mg | RECTAL | Status: DC | PRN

## 2014-12-02 MED ORDER — ACETAMINOPHEN 325 MG PO TABS: 650.0000 mg | ORAL_TABLET | ORAL | Status: DC | PRN

## 2014-12-02 MED ORDER — POTASSIUM CHLORIDE 20 MEQ PO PACK
40.0000 meq | PACK | Freq: Once | ORAL | Status: DC
  Filled 2014-12-02: qty 2

## 2014-12-02 MED ORDER — POTASSIUM CHLORIDE 20 MEQ/15ML (10%) PO SOLN
40.0000 meq | Freq: Once | ORAL | Status: AC
  Administered 2014-12-02: 40 meq via ORAL
  Filled 2014-12-02: qty 30

## 2014-12-02 MED ORDER — LEVOTHYROXINE SODIUM 88 MCG PO TABS
88.0000 ug | ORAL_TABLET | Freq: Every day | ORAL | Status: DC
  Administered 2014-12-03 – 2014-12-06 (×4): 88 ug via ORAL
  Filled 2014-12-02 (×4): qty 1

## 2014-12-02 MED ORDER — LEVOTHYROXINE SODIUM 50 MCG PO TABS: 75.0000 ug | ORAL_TABLET | Freq: Every day | ORAL | Status: DC

## 2014-12-02 NOTE — ED Notes (Signed)
Placed pt on bedpan. Pt stated she had to void, but was unable to do so.

## 2014-12-02 NOTE — ED Notes (Signed)
Per EMS: Dizziness and hypertension since 0930 this am.  Has hx of dizziness, normally resolved with rest and sitting, today it is not.  Has been seen before and physician attributes it to hypertension.  She is hypertension today, 174/82.  No focal neural symptoms noted.  NSR, 12 lead unremarkable.  No acuter distress noted.

## 2014-12-02 NOTE — ED Notes (Signed)
MRI called and states pt has a couple new strokes and is also extremely claustrophobic. Will report to [facility] and request meds.

## 2014-12-02 NOTE — ED Provider Notes (Signed)
CSN: [id number]     Arrival date & time 12/02/14  1252 History   First MD Initiated Contact with Patient 12/02/14 1322     Chief Complaint  Patient presents with  . Dizziness     (Consider location/radiation/quality/duration/timing/severity/associated sxs/prior Treatment) The history is provided by the patient and a relative. The history is limited by the condition of the patient. No language interpreter was used.     Patient is an 81 year old female with history of CAD, pulmonary hypertension, pulmonary lesion, hypertension hyperlipidemia, she was brought to the emergency room via EMS after being unable to get herself up off the floor for several hours. She denies falling, loss of consciousness or head trauma however she does have abrasions on her left forearm and had somehow gotten onto the floor the bathroom. She states that the room was spinning around her and she was unable to stand up due to dizziness and generalized weakness.  She denies any other symptoms at this time.   Specifically she denies confusion, one sided weakness, slurred speech, headache, ear pain, decreased hearing, recent URI, shortness of breath, wheeze, chest pain, abdominal pain, nausea, vomiting, diarrhea, dysuria. Her family at the bedside states that she has had dizziness episodes over the past several months that it become more frequent. They state she has had progressive deconditioning and exertional shortness of breath. They're concerned that she rarely eats. The patient does live alone, is without any in-home care.  Her son visited her yesterday and she was at her baseline.  The patient reports that she was up this morning, able to walk and began to make breakfast, make her bed, and then went to the restroom and somehow fell and she was unable to get herself back up due to dizziness.  Her daughter was unable to get ahold of her by phone this morning, and found her at approximately 11:30 am.  The daughter was unable to  assign the pt off the ground, she was unable to stand.  911 was then called EMS brought her to the ED.  At her baseline she ambulates without any assistance.    Past Medical History  Diagnosis Date  . Coronary artery disease     NONOBSTRUCTIVE WITH LVH AND PROMINENT THEBESIAN VEINS, CURRENTLY WITHOUT CHEST PAIN  . SOB (shortness of breath)     WITH WALKING UP STAIRS  . Incontinence   . Back pain   . Asymmetric septal hypertrophy (HCC)   . Mitral regurgitation   . Pulmonary hypertension ([place])   . TIA (transient ischemic attack)   . Syncope and collapse     HISTORY SYNCOPE  . History of vein stripping   . Esophagitis   . Other nonspecific abnormal finding of lung field   . Benign neoplasm of colon 02/06/2005    Tubular adenomatous  . Diverticulosis of colon (without mention of hemorrhage)   . Complication of anesthesia     hard to sedate  . Hyperlipidemia     takes Lipitor daily  . Hypertension     takes Amlodipine and Metoprolol daily and Losartan  . History of bronchitis     yrs ago  . Headache(784.0)     occasionally  . Arthritis   . Joint pain     takes Anaprox prn  . Bruises easily   . GERD (gastroesophageal reflux disease)     takes Prilosec prn  . Constipation   . Urinary frequency   . Urinary urgency   . Hypothyroidism  takes Synthroid daily  . Macular degeneration, dry     mild   Past Surgical History  Procedure Laterality Date  . Cataract extraction, bilateral    . Lung biopsy    . Cardiac catheterization  09/2008  . Bil wrist surgery      d/t tendonitis  . Vein stripping in both legs    . Dilation and curettage of uterus    . Colonoscopy    . Video bronchoscopy with endobronchial navigation Bilateral 04/12/2012    Procedure: VIDEO BRONCHOSCOPY WITH ENDOBRONCHIAL NAVIGATION;  Surgeon: Collene Gobble, MD;  Location: [facility] OR;  Service: Thoracic;  Laterality: Bilateral;   Family History  Problem Relation Age of Onset  . Heart disease Mother   .  Prostate cancer Father   . Hypertension Father    Social History  Substance Use Topics  . Smoking status: Never Smoker   . Smokeless tobacco: Never Used  . Alcohol Use: No   OB History    No data available     Review of Systems  Constitutional: Negative for fever, chills, diaphoresis and fatigue.  HENT: Negative.   Eyes: Negative.   Respiratory: Positive for cough. Negative for chest tightness, shortness of breath and wheezing.   Cardiovascular: Negative.  Negative for chest pain, palpitations and leg swelling.  Gastrointestinal: Negative.  Negative for nausea, vomiting, abdominal pain, diarrhea, constipation and abdominal distention.  Endocrine: Negative.   Genitourinary: Negative for dysuria, urgency, frequency, hematuria, flank pain, decreased urine volume and difficulty urinating.  Musculoskeletal: Positive for gait problem.  Skin: Negative.  Negative for color change.  Neurological: Positive for dizziness and weakness. Negative for seizures, facial asymmetry, speech difficulty, light-headedness, numbness and headaches.  Psychiatric/Behavioral: Negative.       Allergies  Amoxicillin and Lodine  Home Medications   Prior to Admission medications   Medication Sig Start Date End Date Taking? Authorizing Provider  amLODipine (NORVASC) 2.5 MG tablet Take 1 tablet (2.5 mg total) by mouth daily. 04/14/12   Donita Brooks, NP  aspirin 81 MG tablet Take 81 mg by mouth daily.    Historical Provider, MD  atorvastatin (LIPITOR) 40 MG tablet Take 20 mg by mouth daily. 11/24/11   Larey Dresser, MD  budesonide-formoterol [facility]) 80-4.5 MCG/ACT inhaler Inhale 2 puffs into the lungs 2 (two) times daily. 03/24/13   Brand Males, MD  levothyroxine (SYNTHROID, LEVOTHROID) 75 MCG tablet Take 75 mcg by mouth daily.      Historical Provider, MD  losartan (COZAAR) 100 MG tablet Take 1 tablet (100 mg total) by mouth daily. 04/15/12   Donita Brooks, NP  metoprolol succinate (TOPROL-XL) 100  MG 24 hr tablet Take 1 tablet (100 mg total) by mouth daily. 04/13/12   Donita Brooks, NP  Multiple Vitamin (MULTIVITAMIN) tablet Take 1 tablet by mouth daily. occasionally     Historical Provider, MD  traMADol (ULTRAM) 50 MG tablet One every 4 hours as needed for pain or cough 04/08/13   Tanda Rockers, MD   BP 147/68 mmHg  Pulse 92  Temp(Src) 98.7 F (37.1 C)  Resp 22  Ht 5\' 4"  (1.626 m)  Wt 130 lb (58.968 kg)  BMI 22.30 kg/m2  SpO2 92% Physical Exam  Constitutional: She is oriented to person, place, and time. She appears well-developed and well-nourished. No distress.  A well-appearing elderly female, nontoxic appearing, NAD  HENT:  Head: Normocephalic and atraumatic.  Right Ear: External ear normal.  Left Ear: External ear normal.  Mouth/Throat: No oropharyngeal exudate.  Eyes: Conjunctivae and EOM are normal. Pupils are equal, round, and reactive to light. Right eye exhibits no discharge. Left eye exhibits no discharge. No scleral icterus.  No nystagmus  Neck: Normal range of motion. No JVD present. No tracheal deviation present. No thyromegaly present.  Cardiovascular: Normal rate, regular rhythm, normal heart sounds, intact distal pulses and normal pulses.  Frequent extrasystoles are present. Exam reveals no gallop and no friction rub.   No murmur heard. Pulses:      Radial pulses are 2+ on the right side, and 2+ on the left side.       Dorsalis pedis pulses are 2+ on the right side, and 2+ on the left side.  Pulmonary/Chest: Effort normal and breath sounds normal. No stridor. No respiratory distress. She has no decreased breath sounds. She has no wheezes. She has no rhonchi. She has no rales. She exhibits no tenderness.  Abdominal: Soft. Normal appearance and bowel sounds are normal. She exhibits no distension and no mass. There is no tenderness. There is no rigidity, no rebound, no guarding and no CVA tenderness.  Musculoskeletal: Normal range of motion. She exhibits no edema  or tenderness.  Lymphadenopathy:    She has no cervical adenopathy.  Neurological: She is alert and oriented to person, place, and time. She has normal reflexes. She displays no atrophy and no tremor. No cranial nerve deficit or sensory deficit. She exhibits normal muscle tone. She displays no seizure activity. Gait abnormal. Coordination normal.  Mental Status:  Alert, oriented x 3,  Answering most questions appropriately, unable to give a coherent history. Speech without evidence of aphasia. Difficult following 2-step commands, able to follow simple 1 step commands Cranial Nerves:  II:  Peripheral visual fields grossly normal, pupils equal, round, reactive to light III,IV, VI: ptosis not present, extra-ocular motions intact bilaterally  V,VII: smile symmetric, facial light touch sensation equal VIII: hearing grossly normal to voice  X: uvula elevates symmetrically  XI: bilateral shoulder shrug symmetric and strong XII: midline tongue extension without fassiculations Motor:  Normal tone. 5/5 in upper and lower extremities bilaterally including strong and equal grip strength and dorsiflexion/plantar flexion Sensory: Light touch normal in all extremities.  Cerebellar: slowed finger-to-nose with bilateral upper extremities, without dysmetria Negative romberg (while seated in bed) Gait: pt able to stand with assistance, but unable to stand on her own - falling backwards CV: distal pulses palpable throughout     Skin: Skin is warm and dry. No rash noted. She is not diaphoretic. No erythema. No pallor.  Psychiatric: She has a normal mood and affect. Her behavior is normal.  Nursing note and vitals reviewed.   ED Course  Procedures (including critical care time) Labs Review Labs Reviewed  CBC WITH DIFFERENTIAL/PLATELET - Abnormal; Notable for the following:    Neutro Abs 8.5 (*)    All other components within normal limits  COMPREHENSIVE METABOLIC PANEL - Abnormal; Notable for the  following:    Potassium 3.2 (*)    Chloride 98 (*)    Glucose, Bld 134 (*)    Calcium 8.4 (*)    Total Protein 6.4 (*)    Albumin 2.2 (*)    ALT 13 (*)    GFR calc non Af Amer 51 (*)    GFR calc Af Amer 59 (*)    All other components within normal limits  URINALYSIS, ROUTINE W REFLEX MICROSCOPIC (NOT AT [facility]) - Abnormal; Notable for the following:  APPearance TURBID (*)    Hgb urine dipstick SMALL (*)    All other components within normal limits  URINE MICROSCOPIC-ADD ON  PROTIME-INR  APTT  CK TOTAL AND CKMB (NOT AT [facility] [place])  I-STAT TROPOININ, ED    Imaging Review No results found. I have personally reviewed and evaluated these images and lab results as part of my medical decision-making.   EKG Interpretation None      MDM   Final diagnoses:  Vertigo    Dizziness, with inability to stand independently, no other focal neurological deficit. Patient was discussed with Dr. Audie Pinto, recommended ordering a MRI and upon results of consult taking neurology as needed, we both agree the patient will be admitted. Workup for vertigo was initiated with a MRI  Her lab work was remarkable hematuria without evidence of UTI, hypokalemia, hypochloremia, cardiac monitoring showed frequent PVC's, initial EKG had sinus tachycardia, with a rate of 100, with LVH  At 1606 MRI department called the primary nurse and reported a "couple new strokes," patient was given a dose of Ativan for her claustrophobia, MRA added, and inpatient neurology consultation. Dr. Armida Sans will consult for stroke, timeline for last known well is unclear, could stroke was not initiated.  Dr. Armida Sans will see the patient after completion of MRI/MRA, and he requested that medicine admit and he will consult. Try at hospitalists age for admission. Dr. Coralyn Pear will admit to tele.  4:57 PM Delsa Grana, PA-C     Delsa Grana, PA-C 12/04/14 1033  Leonard Schwartz, MD 12/08/14 [phone]

## 2014-12-02 NOTE — H&P (Signed)
[facility] History and Physical  Laurie [name] VZD:[id number] DOB: 1924/09/28 DOA: 12/02/2014  Referring physician:  PCP: Geoffery Lyons, MD   Chief Complaint: Status post fall  HPI: Laurie [name] is a 81 y.o. female with a past medical history of hypertension, dyslipidemia, coronary artery disease, brought to the emergency department by EMS after she was found down by her daughter. History was obtained from family members who are present at bedside as she was unable to provide reliable history. She is confused and disoriented in the emergency department. Family members report that she has had a gradual functional decline over the past 6 months becoming more dependent. She no longer drives and family members bring food to her home. Her daughter last saw her at her baseline yesterday evening around 8:00. This morning she rushed over to her home when she didn't pick up the phone around 11:00. She found her on the floor outside of her bathroom. Laurie [name] was able to state that she felt too dizzy to stand up. There were no reports of chest pain, nausea, vomiting, palpitations, shortness of breath prior to event. Workup in the emergency department included MRI of the brain that revealed acute CVA per radiology, official radiology report is pending at the time of this dictation. She was given IV Ativan for the procedure which, per family members, lead to increased confusion afterwards.                                                       Review of Systems:  Unable to obtain reliable review of systems due to acute CVA and likely delirium  Past Medical History  Diagnosis Date  . Coronary artery disease     NONOBSTRUCTIVE WITH LVH AND PROMINENT THEBESIAN VEINS, CURRENTLY WITHOUT CHEST PAIN  . SOB (shortness of breath)     WITH WALKING UP STAIRS  . Incontinence   . Back pain   . Asymmetric septal hypertrophy (HCC)   . Mitral regurgitation   . Pulmonary hypertension ([place])   . TIA  (transient ischemic attack)   . Syncope and collapse     HISTORY SYNCOPE  . History of vein stripping   . Esophagitis   . Other nonspecific abnormal finding of lung field   . Benign neoplasm of colon 02/06/2005    Tubular adenomatous  . Diverticulosis of colon (without mention of hemorrhage)   . Complication of anesthesia     hard to sedate  . Hyperlipidemia     takes Lipitor daily  . Hypertension     takes Amlodipine and Metoprolol daily and Losartan  . History of bronchitis     yrs ago  . Headache(784.0)     occasionally  . Arthritis   . Joint pain     takes Anaprox prn  . Bruises easily   . GERD (gastroesophageal reflux disease)     takes Prilosec prn  . Constipation   . Urinary frequency   . Urinary urgency   . Hypothyroidism     takes Synthroid daily  . Macular degeneration, dry     mild   Past Surgical History  Procedure Laterality Date  . Cataract extraction, bilateral    . Lung biopsy    . Cardiac catheterization  09/2008  . Bil wrist surgery      d/t  tendonitis  . Vein stripping in both legs    . Dilation and curettage of uterus    . Colonoscopy    . Video bronchoscopy with endobronchial navigation Bilateral 04/12/2012    Procedure: VIDEO BRONCHOSCOPY WITH ENDOBRONCHIAL NAVIGATION;  Surgeon: Collene Gobble, MD;  Location: [place] OR;  Service: Thoracic;  Laterality: Bilateral;   Social History:  reports that she has never smoked. She has never used smokeless tobacco. She reports that she does not drink alcohol or use illicit drugs.  Allergies  Allergen Reactions  . Amoxicillin     Pt states that she swells up with this drug  . Lodine [Etodolac]     Pt states that she breaks out in hives    Family History  Problem Relation Age of Onset  . Heart disease Mother   . Prostate cancer Father   . Hypertension Father     Prior to Admission medications   Medication Sig Start Date End Date Taking? Authorizing Provider  amLODipine (NORVASC) 2.5 MG tablet Take 1  tablet (2.5 mg total) by mouth daily. 04/14/12   Donita Brooks, NP  aspirin 81 MG tablet Take 81 mg by mouth daily.    Historical Provider, MD  atorvastatin (LIPITOR) 40 MG tablet Take 20 mg by mouth daily. 11/24/11   Larey Dresser, MD  budesonide-formoterol [facility]) 80-4.5 MCG/ACT inhaler Inhale 2 puffs into the lungs 2 (two) times daily. 03/24/13   Brand Males, MD  levothyroxine (SYNTHROID, LEVOTHROID) 75 MCG tablet Take 75 mcg by mouth daily.      Historical Provider, MD  losartan (COZAAR) 100 MG tablet Take 1 tablet (100 mg total) by mouth daily. 04/15/12   Donita Brooks, NP  metoprolol succinate (TOPROL-XL) 100 MG 24 hr tablet Take 1 tablet (100 mg total) by mouth daily. 04/13/12   Donita Brooks, NP  Multiple Vitamin (MULTIVITAMIN) tablet Take 1 tablet by mouth daily. occasionally     Historical Provider, MD  traMADol [facility]) 50 MG tablet One every 4 hours as needed for pain or cough 04/08/13   Tanda Rockers, MD   Physical Exam: Filed Vitals:   12/02/14 1400 12/02/14 1430 12/02/14 1500 12/02/14 1650  BP: 155/82 147/68 144/73 174/86  Pulse: 96 92 99 110  Temp:      Resp: 25 22 23 21   Height:      Weight:      SpO2: 93% 92% 90% 92%    Wt Readings from Last 3 Encounters:  12/02/14 58.968 kg (130 lb)  07/15/13 70.489 kg (155 lb 6.4 oz)  04/08/13 69.4 kg (153 lb)    General:  Patient is confused, disoriented, distractible, pulling out pulse ox, at one point pulled out rounding list from my pocket. She cannot answer questions accurately or participate in her own plan of care. Eyes: PERRL, normal lids, irises & conjunctiva ENT: grossly normal hearing, lips & tongue, dry oral mucosa Neck: no LAD, masses or thyromegaly Cardiovascular: RRR, no m/r/g. No LE edema. Telemetry: SR, no arrhythmias  Respiratory: CTA bilaterally, no w/r/r. Normal respiratory effort. Abdomen: soft, ntnd Skin: There is hematoma over lateral aspect of forearm Musculoskeletal: grossly normal tone  BUE/BLE Psychiatric: grossly normal mood and affect, speech fluent and appropriate Neurologic: Patient is confused, disoriented, unable to provide history. During my encounter she was easily distractible, having inattention. It was difficult to perform adequate neurologic examination given the presence of delirium and her ability to follow-through neurologic exam. She seemed to move upper extremities  fairly well, possible she may have right lower extremity weakness. Deep tendon reflexes appear to be intact.           Labs on Admission:  Basic Metabolic Panel:  Recent Labs Lab 12/02/14 1330  NA 136  K 3.2*  CL 98*  CO2 28  GLUCOSE 134*  BUN 8  CREATININE 0.97  CALCIUM 8.4*   Liver Function Tests:  Recent Labs Lab 12/02/14 1330  AST 28  ALT 13*  ALKPHOS 80  BILITOT 0.7  PROT 6.4*  ALBUMIN 2.2*   No results for input(s): LIPASE, AMYLASE in the last 168 hours. No results for input(s): AMMONIA in the last 168 hours. CBC:  Recent Labs Lab 12/02/14 1330  WBC 9.9  NEUTROABS 8.5*  HGB 12.0  HCT 38.2  MCV 86.6  PLT 246   Cardiac Enzymes: No results for input(s): CKTOTAL, CKMB, CKMBINDEX, TROPONINI in the last 168 hours.  BNP (last 3 results) No results for input(s): BNP in the last 8760 hours.  ProBNP (last 3 results) No results for input(s): PROBNP in the last 8760 hours.  CBG: No results for input(s): GLUCAP in the last 168 hours.  Radiological Exams on Admission: No results found.  EKG: Independently reviewed. Sinus rhythm  Assessment/Plan Principal Problem:   CVA (cerebral infarction) Active Problems:   Hypothyroidism   Pulmonary hypertension (HCC)   Dyslipidemia   Hypokalemia   Dizziness   CVA (cerebral vascular accident) ([place])   1. Acute cerebrovascular accident. Laurie Simmering is a 81 year old female with a history of hypertension and dyslipidemia presenting to the emergency department after being found down by her daughter at home. Patient  currently resides home alone. Her daughter last saw her at her baseline yesterday evening around 8 PM. Laurie Knust did complain of dizziness. Workup in the emergency department included an MRI of brain with radiology reporting acute CVA. Official radiology report pending at the time of this dictation. Will place under CVA protocol and treat with aspirin and statin. Will perform neuro checks and bedside swallow screen. I ordered a transthoracic echocardiogram and carotid Dopplers. Physical therapy, occupational therapy, speech pathology, social work consultations placed. Urology has been consulted for a further recommendations from them. 2. Delirium. Family members reporting that mental status changes worsening after the administration of IV Ativan. She had inattention, altered level of consciousness, was easily distractible and had difficulties following through with neurologic examination. Will stop benzodiazepine therapy. Will provide gentle IV fluid hydration with normal saline, reorientation therapy, try to avoid sedatives, early mobilization a possible. 3. Dizziness. I suspect secondary to acute CVA. Await official radiology report from MRI. 4. Dyslipidemia. Check fasting lipid panel meanwhile continue Lipitor.  5. Hypertension. Patient presenting with acute CVA will allow for permissive hypertension, restart antihypertensives agents in the next 24 hours 6. Hypothyroidism. Will check a TSH, continue Synthroid 75 g by mouth daily 7. DVT prophylaxis. Lovenox    Code Status: DO NOT RESUSCITATE Family Communication: I spoke with patient's family members who are present at bedside Disposition Plan: Will admit to inpatient service at this point she will require greater than 2 nights hospitalization  Time spent: 70 min  Kelvin Cellar [facility] Pager [phone]

## 2014-12-02 NOTE — Progress Notes (Signed)
Pt had no appreciable affect from ativan- became confused and agitated. Pt did sated before receiving ativan that she had never received it before. She didn't benefit unfortunately from receiving it.

## 2014-12-02 NOTE — Progress Notes (Addendum)
Patient arrived from ED around 1730 alert but not oriented to time, place or situation will obey commands and is trying to get out of bed family is present and will stay with her tonoght. Telemetry is set up and I will continue to monitor./

## 2014-12-02 NOTE — Consult Note (Addendum)
Referring Physician: ED    Chief Complaint:   HPI:                                                                                                                                         Laurie [name] is an 81 y.o. female with a past medical history that is relevant for hypertension, hyperlipidemia, CAD, TIA, syncope, pulmonary hypertension, and chronic episodic vertigo, brought to the emergency room via EMS after being unable to get herself up off the floor for several hours. Patient lives by herself, is presently confused and thus her daughter supplies all medical information. Her daughter said that she went to see her around 15 am this morning because she was not picking up her phone and found her lying in the floor, complaining of dizziness, with bruises in her arms but no confused or demonstrating speech changes, weakness in her face or extremities. Daughter stated that she was told by her mother that she got up from bed and was trying to go to the bathroom when felt dizzy and collapsed to the floor. No reported premonitory cardiac symptoms. [name] denies HA, vertigo, double vision, difficulty swallowing, slurred speech, language or vision impairment. MRI/MRA brain were personally reviewed and showed multiple small areas of restricted diffusion involving both cerebral hemispheres consistent with acute ischemia, likely embolism. MRA brain was also reviewed, degraded by motion but without evidence of ;arge proximal artery occlusion.  Work up in the ED also includes UA without evidence of infection, wbc 6.0, hemoglobin 10, K 3.2, Cr 0.97, Na 136, CK 780  Date last known well: 12/01/14 Time last known well: 8 pm tPA Given: no, late presentation   Past Medical History  Diagnosis Date  . Coronary artery disease     NONOBSTRUCTIVE WITH LVH AND PROMINENT THEBESIAN VEINS, CURRENTLY WITHOUT CHEST PAIN  . SOB (shortness of breath)     WITH WALKING UP STAIRS  . Incontinence   . Back pain   .  Asymmetric septal hypertrophy (HCC)   . Mitral regurgitation   . Pulmonary hypertension ([place])   . TIA (transient ischemic attack)   . Syncope and collapse     HISTORY SYNCOPE  . History of vein stripping   . Esophagitis   . Other nonspecific abnormal finding of lung field   . Benign neoplasm of colon 02/06/2005    Tubular adenomatous  . Diverticulosis of colon (without mention of hemorrhage)   . Complication of anesthesia     hard to sedate  . Hyperlipidemia     takes Lipitor daily  . Hypertension     takes Amlodipine and Metoprolol daily and Losartan  . History of bronchitis     yrs ago  . Headache(784.0)     occasionally  . Arthritis   . Joint pain     takes Anaprox prn  . Bruises easily   .  GERD (gastroesophageal reflux disease)     takes Prilosec prn  . Constipation   . Urinary frequency   . Urinary urgency   . Hypothyroidism     takes Synthroid daily  . Macular degeneration, dry     mild    Past Surgical History  Procedure Laterality Date  . Cataract extraction, bilateral    . Lung biopsy    . Cardiac catheterization  09/2008  . Bil wrist surgery      d/t tendonitis  . Vein stripping in both legs    . Dilation and curettage of uterus    . Colonoscopy    . Video bronchoscopy with endobronchial navigation Bilateral 04/12/2012    Procedure: VIDEO BRONCHOSCOPY WITH ENDOBRONCHIAL NAVIGATION;  Surgeon: Collene Gobble, MD;  Location: [facility] OR;  Service: Thoracic;  Laterality: Bilateral;    Family History  Problem Relation Age of Onset  . Heart disease Mother   . Prostate cancer Father   . Hypertension Father    Social History:  reports that she has never smoked. She has never used smokeless tobacco. She reports that she does not drink alcohol or use illicit drugs. Family history: no epilepsy, brain tumor, or brain aneurysm Allergies:  Allergies  Allergen Reactions  . Amoxicillin     Pt states that she swells up with this drug  . Lodine [Etodolac]     Pt  states that she breaks out in hives    Medications:                                                                                                                           I have reviewed the patient's current medications.  ROS:  Unable to obtain due to mental status                                                                                                                                     History obtained from chart review and the patient   Physical exam:  Constitutional: well developed, pleasant female in no apparent distress. Blood pressure 147/68, pulse 92, temperature 98.7 F (37.1 C), resp. rate 22, height 5' 4"  (1.626 m), weight 58.968 kg (130 lb), SpO2 92 %. Eyes: no jaundice or exophthalmos.  Head: normocephalic. Neck: supple, no bruits, no JVD. Cardiac: no murmurs. Lungs: clear. Abdomen: soft, no tender, no mass. Extremities: no edema, clubbing,  or cyanosis.  Skin: no rash  Neurologic Examination:                                                                                                      General: NAD Mental Status: Alert, awake but confused. Speech fluent without evidence of aphasia.  Able to follow simple step commands without difficulty. Cranial Nerves: II: Discs flat bilaterally; Visual fields grossly normal, pupils equal, round, reactive to light and accommodation III,IV, VI: ptosis not present, extra-ocular motions intact bilaterally V,VII: smile symmetric, facial light touch sensation normal bilaterally VIII: hearing normal bilaterally IX,X: uvula rises symmetrically XI: bilateral shoulder shrug XII: midline tongue extension without atrophy or fasciculations Motor: Moves all extremities spontaneously and symmetrically Tone normal. Sensory: Pinprick and light touch testing unreliable due to confusion Deep Tendon Reflexes:  1 all over Plantars: Right: downgoing   Left: downgoing Cerebellar: Can not be tested due to confusion Gait:  No  tested due to multiple leads    Results for orders placed or performed during the hospital encounter of 12/02/14 (from the past 48 hour(s))  CBC with Differential     Status: Abnormal   Collection Time: 12/02/14  1:30 PM  Result Value Ref Range   WBC 9.9 4.0 - 10.5 K/uL   RBC 4.41 3.87 - 5.11 MIL/uL   Hemoglobin 12.0 12.0 - 15.0 g/dL   HCT 38.2 36.0 - 46.0 %   MCV 86.6 78.0 - 100.0 fL   MCH 27.2 26.0 - 34.0 pg   MCHC 31.4 30.0 - 36.0 g/dL   RDW 15.0 11.5 - 15.5 %   Platelets 246 150 - 400 K/uL   Neutrophils Relative % 86 %   Neutro Abs 8.5 (H) 1.7 - 7.7 K/uL   Lymphocytes Relative 7 %   Lymphs Abs 0.7 0.7 - 4.0 K/uL   Monocytes Relative 6 %   Monocytes Absolute 0.6 0.1 - 1.0 K/uL   Eosinophils Relative 0 %   Eosinophils Absolute 0.0 0.0 - 0.7 K/uL   Basophils Relative 1 %   Basophils Absolute 0.1 0.0 - 0.1 K/uL  Comprehensive metabolic panel     Status: Abnormal   Collection Time: 12/02/14  1:30 PM  Result Value Ref Range   Sodium 136 135 - 145 mmol/L   Potassium 3.2 (L) 3.5 - 5.1 mmol/L   Chloride 98 (L) 101 - 111 mmol/L   CO2 28 22 - 32 mmol/L   Glucose, Bld 134 (H) 65 - 99 mg/dL   BUN 8 6 - 20 mg/dL   Creatinine, Ser 0.97 0.44 - 1.00 mg/dL   Calcium 8.4 (L) 8.9 - 10.3 mg/dL   Total Protein 6.4 (L) 6.5 - 8.1 g/dL   Albumin 2.2 (L) 3.5 - 5.0 g/dL   AST 28 15 - 41 U/L   ALT 13 (L) 14 - 54 U/L   Alkaline Phosphatase 80 38 - 126 U/L   Total Bilirubin 0.7 0.3 - 1.2 mg/dL   GFR calc non Af Amer 51 (L) >60 mL/min   GFR calc Af Amer 59 (L) >60 mL/min  Comment: (NOTE) The eGFR has been calculated using the CKD EPI equation. This calculation has not been validated in all clinical situations. eGFR's persistently <60 mL/min signify possible Chronic Kidney Disease.    Anion gap 10 5 - 15  I-Stat Troponin, ED (not at [facility])     Status: None   Collection Time: 12/02/14  1:43 PM  Result Value Ref Range   Troponin i, poc 0.00 0.00 - 0.08 ng/mL   Comment 3             Comment: Due to the release kinetics of cTnI, a negative result within the first hours of the onset of symptoms does not rule out myocardial infarction with certainty. If myocardial infarction is still suspected, repeat the test at appropriate intervals.   Urinalysis, Routine w reflex microscopic (not at [facility])     Status: Abnormal   Collection Time: 12/02/14  2:23 PM  Result Value Ref Range   Color, Urine YELLOW YELLOW   APPearance TURBID (A) CLEAR   Specific Gravity, Urine 1.010 1.005 - 1.030   pH 7.5 5.0 - 8.0   Glucose, UA NEGATIVE NEGATIVE mg/dL   Hgb urine dipstick SMALL (A) NEGATIVE   Bilirubin Urine NEGATIVE NEGATIVE   Ketones, ur NEGATIVE NEGATIVE mg/dL   Protein, ur NEGATIVE NEGATIVE mg/dL   Urobilinogen, UA 1.0 0.0 - 1.0 mg/dL   Nitrite NEGATIVE NEGATIVE   Leukocytes, UA NEGATIVE NEGATIVE  Urine microscopic-add on     Status: None   Collection Time: 12/02/14  2:23 PM  Result Value Ref Range   Squamous Epithelial / LPF RARE RARE   RBC / HPF 0-2 <3 RBC/hpf   No results found.    Assessment: 81 y.o. female with multiple risk factors for stroke, brought in by EMS after being found on the floor by her daughter around 52 am today. Patient is presently confused but without lateralizing signs on neuro-exam. MRI brain demonstrated multiple small areas of acute ischemia with a pattern that appears to be consistent with shower of emboli. She was not administered tpa due to late presentation. Admit to medicine. Stroke work up as delineated below.  Stroke team will follow up tomorrow  Stroke Risk Factors - age, hypertension, hyperlipidemia, CAD, TIA   Plan: 1. HgbA1c, fasting lipid panel 2. MRI, MRA  of the brain without contrast 3. Echocardiogram 4. Carotid dopplers 5. Prophylactic therapy-aspirin after passing swallowing evaluation and pending results of stroke work up. 6. Risk factor modification 7. Telemetry monitoring 8. Frequent neuro checks 9. PT/OT SLP 10.  NPO  Dorian Pod, MD  [facility] [phone]  12/02/2014, 4:44 PM

## 2014-12-03 ENCOUNTER — Inpatient Hospital Stay (HOSPITAL_COMMUNITY): Payer: Commercial Managed Care - HMO

## 2014-12-03 DIAGNOSIS — I10 Essential (primary) hypertension: Secondary | ICD-10-CM | POA: Insufficient documentation

## 2014-12-03 DIAGNOSIS — E785 Hyperlipidemia, unspecified: Secondary | ICD-10-CM

## 2014-12-03 DIAGNOSIS — I639 Cerebral infarction, unspecified: Secondary | ICD-10-CM

## 2014-12-03 DIAGNOSIS — I6349 Cerebral infarction due to embolism of other cerebral artery: Secondary | ICD-10-CM

## 2014-12-03 DIAGNOSIS — E039 Hypothyroidism, unspecified: Secondary | ICD-10-CM

## 2014-12-03 DIAGNOSIS — I6789 Other cerebrovascular disease: Secondary | ICD-10-CM

## 2014-12-03 LAB — CBC
HEMATOCRIT: 37 % (ref 36.0–46.0)
HEMOGLOBIN: 11.9 g/dL — AB (ref 12.0–15.0)
MCH: 27.8 pg (ref 26.0–34.0)
MCHC: 32.2 g/dL (ref 30.0–36.0)
MCV: 86.4 fL (ref 78.0–100.0)
Platelets: 208 10*3/uL (ref 150–400)
RBC: 4.28 MIL/uL (ref 3.87–5.11)
RDW: 15.5 % (ref 11.5–15.5)
WBC: 10.2 10*3/uL (ref 4.0–10.5)

## 2014-12-03 LAB — BASIC METABOLIC PANEL
ANION GAP: 11 (ref 5–15)
BUN: 10 mg/dL (ref 6–20)
CO2: 25 mmol/L (ref 22–32)
Calcium: 8.2 mg/dL — ABNORMAL LOW (ref 8.9–10.3)
Chloride: 101 mmol/L (ref 101–111)
Creatinine, Ser: 0.98 mg/dL (ref 0.44–1.00)
GFR calc Af Amer: 58 mL/min — ABNORMAL LOW (ref >60)
GFR calc non Af Amer: 50 mL/min — ABNORMAL LOW (ref >60)
GLUCOSE: 96 mg/dL (ref 65–99)
POTASSIUM: 4.4 mmol/L (ref 3.5–5.1)
Sodium: 137 mmol/L (ref 135–145)

## 2014-12-03 LAB — LIPID PANEL
Cholesterol: 125 mg/dL (ref 0–200)
HDL: 38 mg/dL — ABNORMAL LOW (ref >40)
LDL CALC: 68 mg/dL (ref 0–99)
Total CHOL/HDL Ratio: 3.3 RATIO
Triglycerides: 93 mg/dL (ref <150)
VLDL: 19 mg/dL (ref 0–40)

## 2014-12-03 LAB — CK: CK TOTAL: 692 U/L — AB (ref 38–234)

## 2014-12-03 MED ORDER — MAGNESIUM SULFATE 2 GM/50ML IV SOLN
2.0000 g | Freq: Once | INTRAVENOUS | Status: AC
  Administered 2014-12-03: 2 g via INTRAVENOUS
  Filled 2014-12-03: qty 50

## 2014-12-03 MED ORDER — METOPROLOL SUCCINATE ER 100 MG PO TB24
100.0000 mg | ORAL_TABLET | Freq: Every day | ORAL | Status: DC
  Administered 2014-12-03 – 2014-12-06 (×4): 100 mg via ORAL
  Filled 2014-12-03 (×4): qty 1

## 2014-12-03 NOTE — Progress Notes (Signed)
VASCULAR LAB PRELIMINARY  PRELIMINARY  PRELIMINARY  PRELIMINARY  Carotid duplex completed.    Preliminary report:  1-39% ICA plaquing. Vertebral artery flow is antegrade.   Bonetta Mostek, RVT 12/03/2014, 11:47 AM

## 2014-12-03 NOTE — Progress Notes (Signed)
STROKE TEAM PROGRESS NOTE   SUBJECTIVE (INTERVAL HISTORY) Her son is at the bedside.  Overall she feels her condition is stable. She had work up done this am and sleepy in the pm during rounds but able to arouse and following simple commands. Son state that pt is living alone and function fully at home. Has some cognitive impairment need support for financial needs.    OBJECTIVE Temp:  [98 F (36.7 C)-99.5 F (37.5 C)] 99.5 F (37.5 C) (10/09 1444) Pulse Rate:  [86-103] 86 (10/09 1444) Cardiac Rhythm:  [-] Ventricular tachycardia (10/09 1541) Resp:  [17-25] 17 (10/09 1444) BP: (110-161)/(60-88) 153/73 mmHg (10/09 1444) SpO2:  [91 %-98 %] 91 % (10/09 1444)  No results for input(s): GLUCAP in the last 168 hours.  Recent Labs Lab 12/02/14 1330 12/03/14 0656  NA 136 137  K 3.2* 4.4  CL 98* 101  CO2 28 25  GLUCOSE 134* 96  BUN 8 10  CREATININE 0.97 0.98  CALCIUM 8.4* 8.2*    Recent Labs Lab 12/02/14 1330  AST 28  ALT 13*  ALKPHOS 80  BILITOT 0.7  PROT 6.4*  ALBUMIN 2.2*    Recent Labs Lab 12/02/14 1330 12/03/14 0656  WBC 9.9 10.2  NEUTROABS 8.5*  --   HGB 12.0 11.9*  HCT 38.2 37.0  MCV 86.6 86.4  PLT 246 208    Recent Labs Lab 12/02/14 1330 12/03/14 1209  CKTOTAL 780* 692*  CKMB 4.7  --     Recent Labs  12/02/14 1330  LABPROT 15.3*  INR 1.19    Recent Labs  12/02/14 1423  COLORURINE YELLOW  LABSPEC 1.010  PHURINE 7.5  GLUCOSEU NEGATIVE  HGBUR SMALL*  BILIRUBINUR NEGATIVE  KETONESUR NEGATIVE  PROTEINUR NEGATIVE  UROBILINOGEN 1.0  NITRITE NEGATIVE  LEUKOCYTESUR NEGATIVE       Component Value Date/Time   CHOL 125 12/03/2014 0656   TRIG 93 12/03/2014 0656   HDL 38* 12/03/2014 0656   CHOLHDL 3.3 12/03/2014 0656   VLDL 19 12/03/2014 0656   LDLCALC 68 12/03/2014 0656   No results found for: HGBA1C No results found for: LABOPIA, COCAINSCRNUR, LABBENZ, AMPHETMU, THCU, LABBARB  No results for input(s): ETH in the last 168 hours.  I  have personally reviewed the radiological images below and agree with the radiology interpretations.  Mri and Mra Head Wo Contrast  12/02/2014   IMPRESSION: 1. Punctate, acute to subacute embolic infarcts in the parietal lobes and left frontal lobe. 2. Mild cerebral atrophy and chronic small vessel ischemic disease. 3. Moderately to severely motion degraded MRA. No evidence of large vessel occlusion.   Carotid Doppler  Bilateral: 1-39% ICA stenosis. Vertebral artery flow is antegrade.  2D Echocardiogram  - Left ventricle: The cavity size was normal. There was moderate focal basal and mild concentric hypertrophy of the left ventricle. Systolic function was normal. The estimated ejection fraction was in the range of 55% to 60%. Wall motion was normal; there were no regional wall motion abnormalities. Features are consistent with a pseudonormal left ventricular filling pattern, with concomitant abnormal relaxation and increased filling pressure (grade 2 diastolic dysfunction). There was no evidence of elevated ventricular filling pressure by Doppler parameters. - Aortic valve: Trileaflet; normal thickness leaflets. There was mild regurgitation. - Aortic root: The aortic root was mildly dilated measurin 41 mm. - Ascending aorta: The ascending aorta was normal in size. - Mitral valve: Structurally normal valve. There was mild regurgitation. - Left atrium: The atrium was mildly dilated. -  Right ventricle: Systolic function was normal. - Right atrium: The atrium was normal in size. - Tricuspid valve: There was moderate regurgitation. - Pulmonic valve: There was mild regurgitation. - Pulmonary arteries: Systolic pressure was severely increased. PA peak pressure: 62 mm Hg (S). - Inferior vena cava: The vessel was normal in size. - Pericardium, extracardiac: There was no pericardial effusion.  TEE pending   PHYSICAL EXAM  Temp:  [98 F (36.7 C)-99.5 F (37.5 C)]  99.5 F (37.5 C) (10/09 1444) Pulse Rate:  [86-103] 86 (10/09 1444) Resp:  [17-25] 17 (10/09 1444) BP: (110-161)/(60-88) 153/73 mmHg (10/09 1444) SpO2:  [91 %-98 %] 91 % (10/09 1444)  General - Well nourished, well developed, sleepy but arousable.  Ophthalmologic - not cooperative on fundi exam due to eye movement.  Cardiovascular - Regular rate and rhythm with no murmur.  Neck - supple, no carotid bruits  Mental Status -  Sleepy but arousable and orientation to place, and person were intact, but not to time. Language including expression, naming, repetition, comprehension was assessed and found intact. Fund of Knowledge was assessed and was impaired.  Cranial Nerves II - XII - II - Visual field intact OU. III, IV, VI - Extraocular movements intact. V - Facial sensation intact bilaterally. VII - Facial movement intact bilaterally. VIII - Hearing & vestibular intact bilaterally. X - Palate elevates symmetrically. XI - Chin turning & shoulder shrug intact bilaterally. XII - Tongue protrusion intact.  Motor Strength - The patient's strength was 4/5 in all extremities, symmetric and pronator drift was absent.  Bulk was normal and fasciculations were absent.   Motor Tone - Muscle tone was assessed at the neck and appendages and was normal.  Reflexes - The patient's reflexes were symmetrical in all extremities and she had no pathological reflexes.  Sensory - Light touch, temperature/pinprick were assessed and were symmetrical.    Coordination - The patient had normal movements in the hands with no ataxia or dysmetria.  Tremor was absent.  Gait and Station - not tested due to sleepiness.   ASSESSMENT/PLAN Laurie [name] is a 81 y.o. female with history of CAD, HTN, HLD, MCI admitted for a fall with complain of dizziness and not able to get up. Symptoms much improved.    Stroke:  Bilateral 3 punctate subcortical infarct, likely embolic secondary to unknown source, suspicious  for afib  MRI  B/L 3 punctate subcortical infarct at MCA territories  MRA  Motion degraded, no large vessel cut off  Carotid Doppler  unremarkable  2D Echo  EF 55-60%  Recommend TEE and potential loop recorder for further evaluation.  LDL 68  HgbA1c Pending  lovenox for VTE prophylaxis  Diet regular Room service appropriate?: Yes; Fluid consistency:: Thin   no antithrombotic prior to admission, now on aspirin 325 mg orally every day  Patient counseled to be compliant with her antithrombotic medications  Ongoing aggressive stroke risk factor management  Therapy recommendations:  pending  Disposition:  pending  Hypertension  Home meds:   Norvasc, metoprolol and losartan Permissive hypertension (OK if <220/120) for 24-48 hours post stroke and then gradually normalized within 5-7 days. Currently on metoprolol  Stable  Patient counseled to be compliant with her blood pressure medications  Hyperlipidemia  Home meds:  none   LDL 68, goal < 70  On the goal  Other Stroke Risk Factors  Advanced age  Coronary artery disease  Other Active Problems  Mild anemia  Elevated CK  Other Pertinent  History  [facility] day # 1   Rosalin Hawking, MD PhD Stroke Neurology 12/03/2014 5:16 PM    To contact Stroke Continuity provider, please refer to [email or web address] After hours, contact General Neurology

## 2014-12-03 NOTE — Progress Notes (Signed)
PATIENT DETAILS Name: Laurie [name] Age: 81 y.o. Sex: female Date of Birth: October 13, 1924 Admit Date: 12/02/2014 Admitting Physician Kelvin Cellar, MD [facility]:CWCBJSE,GBTDVVO A, MD  Subjective: Pleasantly confused. Moves all 4 ext.  Assessment/Plan: Principal Problem: Acute CVA (cerebral infarction):suspect embolic. EKG/Tele-neg for Afib. Await Echo/Carotids. Patient with dementia/frailty-not a great long term anticoagulation candidate. Spoke with son at bedside-explained that patient may need TEE/Loop if family willing to consider anticoagulation-he was not very sure how aggressively family would want embolic CVA worked up. He will talk with his siblings and let us know. For now continue telemetry monitoring and await further recommendations from Neurology.LDL 68 (goal <70)-continue Statin. A1C pending.  Active Problems: Delirium:suspect related to CVA/Ativan. Currently at baseline-but at risk of sundowning given hx of Dementia.  Dementia:at risk for Delirium. Supportive care-await Benzo's  Dyslipidemia:continue Statin-LDL at goal-see above  Hypothyroidism:continue Synthroid  Essential HYW:VPXTGG Metoprolol-follow BP and resume Losartan, Amlodipine in the next few days  Hypertensive Heart Disease:reviewed prior outpatient cards note from 2013-although patient had mild focal septal hypertrophy-this was not felt to be HOCM-but from LVH from HTN.Await Echo  Chronic Diastolic YIR:[id number]. Follow-stop IVF.  Hx of Bronchiectasis:stable-lungs clear-prn Bronchodilators. Outpatient follow up with PCCM  Disposition: Remain inpatient  Antimicrobial agents  See below  Anti-infectives    None      DVT Prophylaxis: Prophylactic Lovenox   Code Status: DNR  Family Communication Son at bedside  Procedures: None  CONSULTS:  neurology  Time spent 30 minutes-Greater than 50% of this time was spent in counseling, explanation of diagnosis, planning of further  management, and coordination of care.  MEDICATIONS: Scheduled Meds: .  stroke: mapping our early stages of recovery book   Does not apply Once  . aspirin  300 mg Rectal Daily   Or  . aspirin  325 mg Oral Daily  . atorvastatin  20 mg Oral Daily  . enoxaparin (LOVENOX) injection  40 mg Subcutaneous Q24H  . levothyroxine  88 mcg Oral QAC breakfast   Continuous Infusions:  PRN Meds:.acetaminophen **OR** acetaminophen, labetalol    PHYSICAL EXAM: Vital signs in last 24 hours: Filed Vitals:   12/03/14 0130 12/03/14 0330 12/03/14 0530 12/03/14 1049  BP: 134/64 110/65 137/60 148/67  Pulse: 90 90 90 86  Temp: 98.6 F (37 C) 98 F (36.7 C) 98 F (36.7 C) 98.7 F (37.1 C)  TempSrc: Oral Oral Oral Oral  Resp: 18 18 18 17   Height:      Weight:      SpO2: 94%  94% 93%    Weight change:  Filed Weights   12/02/14 1304  Weight: 58.968 kg (130 lb)   Body mass index is 22.3 kg/(m^2).   Gen Exam: Awake and alert with clear speech.  Neck: Supple, No JVD.   Chest: B/L Clear.   CVS: S1 S2 Regular, no murmurs.  Abdomen: soft, BS +, non tender, non distended.  Extremities: no edema, lower extremities warm to touch. Neurologic: Non Focal.   Skin: No Rash.   Wounds: N/A.   Intake/Output from previous day: No intake or output data in the 24 hours ending 12/03/14 1250   LAB RESULTS: CBC  Recent Labs Lab 12/02/14 1330 12/03/14 0656  WBC 9.9 10.2  HGB 12.0 11.9*  HCT 38.2 37.0  PLT 246 208  MCV 86.6 86.4  MCH 27.2 27.8  MCHC 31.4 32.2  RDW 15.0 15.5  LYMPHSABS 0.7  --  MONOABS 0.6  --   EOSABS 0.0  --   BASOSABS 0.1  --     Chemistries   Recent Labs Lab 12/02/14 1330 12/03/14 0656  NA 136 137  K 3.2* 4.4  CL 98* 101  CO2 28 25  GLUCOSE 134* 96  BUN 8 10  CREATININE 0.97 0.98  CALCIUM 8.4* 8.2*    CBG: No results for input(s): GLUCAP in the last 168 hours.  GFR Estimated Creatinine Clearance: 34.9 mL/min (by C-G formula based on Cr of  0.98).  Coagulation profile  Recent Labs Lab 12/02/14 1330  INR 1.19    Cardiac Enzymes  Recent Labs Lab 12/02/14 1330  CKMB 4.7    Invalid input(s): POCBNP No results for input(s): DDIMER in the last 72 hours. No results for input(s): HGBA1C in the last 72 hours.  Recent Labs  12/03/14 0656  CHOL 125  HDL 38*  LDLCALC 68  TRIG 93  CHOLHDL 3.3    Recent Labs  12/02/14 2005  TSH 0.575   No results for input(s): VITAMINB12, FOLATE, FERRITIN, TIBC, IRON, RETICCTPCT in the last 72 hours. No results for input(s): LIPASE, AMYLASE in the last 72 hours.  Urine Studies No results for input(s): UHGB, CRYS in the last 72 hours.  Invalid input(s): UACOL, UAPR, USPG, UPH, UTP, UGL, UKET, UBIL, UNIT, UROB, ULEU, UEPI, UWBC, URBC, UBAC, CAST, UCOM, BILUA  MICROBIOLOGY: No results found for this or any previous visit (from the past 240 hour(s)).  RADIOLOGY STUDIES/RESULTS: Mr Virgel Paling Wo Contrast  12/02/2014   CLINICAL DATA:  Vertigo in generalized weakness.  EXAM: MRI HEAD WITHOUT CONTRAST  MRA HEAD WITHOUT CONTRAST  TECHNIQUE: Multiplanar, multiecho pulse sequences of the brain and surrounding structures were obtained without intravenous contrast. Angiographic images of the head were obtained using MRA technique without contrast.  COMPARISON:  Head CT 09/19/2013  FINDINGS: MRI HEAD FINDINGS  Some sequences are mildly to moderately motion degraded. There is a punctate focus of cortical restricted diffusion in the right parietal lobe. There is a punctate focus of abnormal diffusion weighted signal in the left parietal cortex without clear restricted diffusion, however evaluation is limited by its small size. A similar punctate focus of diffusion abnormality is present in the subcortical white matter of the left superior frontal gyrus. There is no acute or subacute large territory infarct.  There is likely a focus of chronic microhemorrhage in the left parietal lobe. There is mild  generalized cerebral atrophy. T2 hyperintensities in the subcortical and deep cerebral white matter are nonspecific but compatible with mild chronic small vessel ischemic disease. There may be a tiny chronic infarct in the left cerebellum. No mass, midline shift, or extra-axial fluid collection is seen.  Prior bilateral cataract extraction is noted. No significant inflammatory disease is seen in the paranasal sinuses or mastoid air cells. Major intracranial vascular flow voids are grossly preserved.  MRA HEAD FINDINGS  The study is moderately to severely motion degraded. The visualized distal vertebral arteries appear patent with the right being dominant. Left vertebral artery is particularly hypoplastic distal to the PICA origin. Basilar artery is patent without gross high-grade stenosis, although evaluation is limited by motion. Flow is present in the PCAs with limited evaluation for stenosis due to motion.  Internal carotid arteries are patent from skullbase to carotid termini without evidence of flow limiting stenosis. A1 and M1 segments are patent. Evaluation for stenosis and evaluation of M2 and A2 segments cannot be adequately performed due to motion  artifact.  IMPRESSION: 1. Punctate, acute to subacute embolic infarcts in the parietal lobes and left frontal lobe. 2. Mild cerebral atrophy and chronic small vessel ischemic disease. 3. Moderately to severely motion degraded MRA. No evidence of large vessel occlusion.   Electronically Signed   By: Logan Bores M.D.   On: 12/02/2014 18:09   Mr Brain Wo Contrast  12/02/2014   CLINICAL DATA:  Vertigo in generalized weakness.  EXAM: MRI HEAD WITHOUT CONTRAST  MRA HEAD WITHOUT CONTRAST  TECHNIQUE: Multiplanar, multiecho pulse sequences of the brain and surrounding structures were obtained without intravenous contrast. Angiographic images of the head were obtained using MRA technique without contrast.  COMPARISON:  Head CT 09/19/2013  FINDINGS: MRI HEAD FINDINGS   Some sequences are mildly to moderately motion degraded. There is a punctate focus of cortical restricted diffusion in the right parietal lobe. There is a punctate focus of abnormal diffusion weighted signal in the left parietal cortex without clear restricted diffusion, however evaluation is limited by its small size. A similar punctate focus of diffusion abnormality is present in the subcortical white matter of the left superior frontal gyrus. There is no acute or subacute large territory infarct.  There is likely a focus of chronic microhemorrhage in the left parietal lobe. There is mild generalized cerebral atrophy. T2 hyperintensities in the subcortical and deep cerebral white matter are nonspecific but compatible with mild chronic small vessel ischemic disease. There may be a tiny chronic infarct in the left cerebellum. No mass, midline shift, or extra-axial fluid collection is seen.  Prior bilateral cataract extraction is noted. No significant inflammatory disease is seen in the paranasal sinuses or mastoid air cells. Major intracranial vascular flow voids are grossly preserved.  MRA HEAD FINDINGS  The study is moderately to severely motion degraded. The visualized distal vertebral arteries appear patent with the right being dominant. Left vertebral artery is particularly hypoplastic distal to the PICA origin. Basilar artery is patent without gross high-grade stenosis, although evaluation is limited by motion. Flow is present in the PCAs with limited evaluation for stenosis due to motion.  Internal carotid arteries are patent from skullbase to carotid termini without evidence of flow limiting stenosis. A1 and M1 segments are patent. Evaluation for stenosis and evaluation of M2 and A2 segments cannot be adequately performed due to motion artifact.  IMPRESSION: 1. Punctate, acute to subacute embolic infarcts in the parietal lobes and left frontal lobe. 2. Mild cerebral atrophy and chronic small vessel ischemic  disease. 3. Moderately to severely motion degraded MRA. No evidence of large vessel occlusion.   Electronically Signed   By: Logan Bores M.D.   On: 12/02/2014 18:09    Oren Binet, MD  [facility] Pager:[phone]  If 7PM-7AM, please contact night-coverage [email or web address] Password [email or web address] 12/03/2014, 12:50 PM   LOS: 1 day

## 2014-12-03 NOTE — Evaluation (Signed)
Occupational Therapy Evaluation Patient Details Name: Laurie [name] MRN: [id number] DOB: 1924/07/10 Today's Date: 12/03/2014    History of Present Illness Laurie [name] is a 81 y.o. female with a past medical history of hypertension, dyslipidemia, coronary artery disease, admitted after being found on the ground by daughter. MRI revealed Punctate, acute to subacute embolic infarcts in the parietal lobes and left frontal lobe.   Clinical Impression   Pt admitted with above. Pt independent with ADLs, PTA. Feel pt will benefit from acute OT to increase independence prior to d/c. Recommending CIR consult.     Follow Up Recommendations  CIR    Equipment Recommendations  Other (comment) (defer to next venue)    Recommendations for Other Services       Precautions / Restrictions Precautions Precautions: Fall Restrictions Weight Bearing Restrictions: No      Mobility Bed Mobility Overal bed mobility: Needs Assistance Bed Mobility: Rolling;Supine to Sit;Sit to Supine Rolling: Mod assist (supervision-Min assist at times) Supine to sit: Min assist Sit to supine: Min assist General bed mobility comments: physical assist with rolling at times (supervision-Mod assist),Used +2 assist to scoot HOB. Cues given for bed mobility.  Transfers Overall transfer level: Needs assistance Equipment used: Rolling walker (2 wheeled) Transfers: Sit to/from Stand Sit to Stand: Max assist         General transfer comment: heavy assist for balance     Balance Overall balance assessment: Needs assistance Sitting balance: Pt requiring min assist at times for sitting balance       Standing balance: Heavy assist (max assist) for standing balance with RW in front for support.                   ADL Overall ADL's : Needs assistance/impaired             Lower Body Bathing: Maximal assistance;Sit to/from stand   Upper Body Dressing : Set up;Supervision/safety;Sitting (supported)    Lower Body Dressing: Maximal assistance;Sit to/from stand   Toilet Transfer: Maximal assistance;RW (sit to stand from bed)        Comments: OT assisted in cleaning pt's bottom when she was in bed-appeared she had been incontinent of BM.           Vision Vision Assessment?: Yes Visual Fields:  (inconsistent in right superior quadrant; did not appear to see OT's finger in left superior quadrant) Additional Comments: pt reported she had been closing one eye to see better and OT asked her if she had double vision but she denied seeing double   Perception     Praxis      Pertinent Vitals/Pain Pain Assessment: No/denies pain     Hand Dominance     Extremity/Trunk Assessment Upper Extremity Assessment Upper Extremity Assessment: RUE deficits/detail;LUE deficits/detail RUE Deficits / Details: less than full AROM shoulder flexion, but more than 50%-able to withstand little resistance LUE Deficits / Details: less than full AROM shoulder flexion, but more than 50%-able to withstand little resistance   Lower Extremity Assessment Lower Extremity Assessment: Defer to PT evaluation RLE Deficits / Details: Grossly 4/5 strength       Communication Communication Communication: No difficulties   Cognition Arousal/Alertness: Awake/alert Behavior During Therapy: WFL for tasks assessed/performed Overall Cognitive Status: Impaired/Different from baseline Area of Impairment: Orientation;Problem solving;Following commands;Memory Orientation Level: Disoriented to;Time   Memory: Decreased short-term memory Following Commands: Follows one step commands with increased time     Problem Solving: Decreased initiation;Slow processing;Requires verbal cues  General Comments       Exercises       Shoulder Instructions      Home Living Family/patient expects to be discharged to:: Inpatient rehab Living Arrangements: Alone Available Help at Discharge: Family;Available 24  hours/day Type of Home: House Home Access: Stairs to enter [employer] of Steps: 3-4 Entrance Stairs-Rails: Right;Left Home Layout: One level               Home Equipment: None   Additional Comments: Information provided from son      Prior Functioning/Environment Level of Independence: Needs assistance    ADL's / Homemaking Assistance Needed: family brought pt groceries. Pt independent with ADLs   Comments: Son reports pt was independent but noticed some short-term memory loss recently (past few months)    OT Diagnosis: Generalized weakness;Altered mental status   OT Problem List: Decreased strength;Decreased activity tolerance;Decreased range of motion;Decreased knowledge of precautions;Decreased knowledge of use of DME or AE;Decreased cognition;Impaired vision/perception;Impaired balance (sitting and/or standing)   OT Treatment/Interventions: Self-care/ADL training;DME and/or AE instruction;Therapeutic activities;Cognitive remediation/compensation;Visual/perceptual remediation/compensation;Patient/family education;Balance training;Therapeutic exercise;Neuromuscular education    OT Goals(Current goals can be found in the care plan section) Acute Rehab OT Goals Patient Stated Goal: not stated OT Goal Formulation: With patient/family Time For Goal Achievement: 12/10/14 Potential to Achieve Goals: Good ADL Goals Pt Will Perform Lower Body Bathing: with set-up;with supervision;sitting/lateral leans Pt Will Perform Lower Body Dressing: sitting/lateral leans;with set-up;with supervision Pt Will Transfer to Toilet: with min assist;ambulating Pt Will Perform Toileting - Clothing Manipulation and hygiene: with set-up;sitting/lateral leans;with supervision  OT Frequency: Min 2X/week   Barriers to D/C:            Co-evaluation              End of Session Equipment Utilized During Treatment: Gait belt;Rolling walker Nurse Communication: Other (comment) (have  +2 to get up)  Activity Tolerance: Patient tolerated treatment well Patient left: in bed;with call bell/phone within reach;with bed alarm set;with nursing/sitter in room   Time: 1027-1046 OT Time Calculation (min): 19 min Charges:  OT General Charges $OT Visit: 1 Procedure OT Evaluation $Initial OT Evaluation Tier I: 1 Procedure G-CodesBenito Mccreedy OTR/L [id number] 12/03/2014, 11:55 AM

## 2014-12-03 NOTE — Evaluation (Addendum)
Physical Therapy Evaluation Patient Details Name: Laurie [name] MRN: [id number] DOB: [birth year] Today's Date: 12/03/2014   History of Present Illness  Laurie [name] is a 81 y.o. female with a past medical history of hypertension, dyslipidemia, coronary artery disease, admitted after being found on the ground by daughter. MRI revealed Punctate, acute to subacute embolic infarcts in the parietal lobes and left frontal lobe.  Clinical Impression  Pt admitted with the above complications. Pt currently with functional limitations due to the deficits listed below (see PT Problem List). Previously independent at home. Today required mod assist for bed mobility and transfers. Unable to lift feet from floor to take steps due to significant posterior lean. Reports significant positional dizziness; recommend check orthostatic vitals when pt can tolerate standing safely. Strong family support; they plan to arrange 24/7 care if needed. Pt will greatly benefit from CIR screen/consult as I feel she would be a good candidate for rehabilitation in this setting. Pt will benefit from skilled PT to increase their independence and safety with mobility to allow discharge to the venue listed below.       Follow Up Recommendations CIR    Equipment Recommendations   (TBD next venue of care)    Recommendations for Other Services Rehab consult     Precautions / Restrictions Precautions Precautions: Fall Restrictions Weight Bearing Restrictions: No      Mobility  Bed Mobility Overal bed mobility: Needs Assistance Bed Mobility: Rolling;Sidelying to Sit;Sit to Sidelying Rolling: Min assist Sidelying to sit: Mod assist;HOB elevated     Sit to sidelying: Mod assist General bed mobility comments: Min assist for sequencing during roll. Mod assist for truncal support to rise to EOB and for LE support to return to supine. Difficulty sequencing. VC throughout.  Transfers Overall transfer level: Needs  assistance Equipment used: Rolling walker (2 wheeled) Transfers: Sit to/from Stand Sit to Stand: Mod assist         General transfer comment: Mod assist for boost to stand from lowest bed setting. Leans heavily to posterior with minimal ability to correct despite tactile and verbal cues to lean forward.  Ambulation/Gait                Stairs            Wheelchair Mobility    Modified Rankin (Stroke Patients Only) Modified Rankin (Stroke Patients Only) Pre-Morbid Rankin Score: Slight disability Modified Rankin: Moderately severe disability     Balance Overall balance assessment: Needs assistance Sitting-balance support: No upper extremity supported;Feet supported Sitting balance-Leahy Scale: Fair Sitting balance - Comments: Tolerated sitting EOB approx 10 minutes focusing on lateral leans onto Lt and Rt elbow. Demonstrates difficulty returning to midline with delayed cognitive processing. Occasionally loss of balance to posterior, left and anterior requiring min assist to regain control. Demonstrates Rt visual neglect with reaching tasks to stress limits of stability.   Standing balance support: Bilateral upper extremity supported Standing balance-Leahy Scale: Poor Standing balance comment: Requires mod assist at all times due to posterior lean. Tolerated approx 4 minutes of standing focusing on weight shifting but with difficulty leaning forward. Unable to lift either foot from floor.                             Pertinent Vitals/Pain Pain Assessment: No/denies pain    Home Living Family/patient expects to be discharged to:: Inpatient rehab Living Arrangements: Alone Available Help at Discharge: Family;Available 24 hours/day Type  of Home: House Home Access: Stairs to enter Entrance Stairs-Rails: Psychiatric nurse of Steps: 3-4 Home Layout: One level Home Equipment: None Additional Comments: Information provided from son    Prior  Function Level of Independence: Independent         Comments: Son reports pt was independent but noticed some short-term memory loss recently (past few months)     Hand Dominance        Extremity/Trunk Assessment   Upper Extremity Assessment: Defer to OT evaluation           Lower Extremity Assessment: RLE deficits/detail RLE Deficits / Details: Grossly 4/5 strength       Communication   Communication: No difficulties  Cognition Arousal/Alertness: Awake/alert Behavior During Therapy: Flat affect Overall Cognitive Status: Impaired/Different from baseline Area of Impairment: Problem solving;Following commands       Following Commands: Follows one step commands with increased time     Problem Solving: Slow processing;Decreased initiation;Difficulty sequencing;Requires verbal cues;Requires tactile cues      General Comments General comments (skin integrity, edema, etc.): Pt reports significant dizziness upon sitting which slowly improved, but again with significant dizziness upon standing that did not improve until returned to seated and supine positions. Would recommend orthostatic vital assessment in future. Son present very supportive.    Exercises        Assessment/Plan    PT Assessment Patient needs continued PT services  PT Diagnosis Difficulty walking;Generalized weakness;Hemiplegia dominant side;Altered mental status   PT Problem List Decreased strength;Decreased activity tolerance;Decreased balance;Decreased mobility;Decreased coordination;Decreased knowledge of use of DME;Decreased safety awareness  PT Treatment Interventions DME instruction;Gait training;Functional mobility training;Therapeutic activities;Therapeutic exercise;Balance training;Neuromuscular re-education;Cognitive remediation;Patient/family education   PT Goals (Current goals can be found in the Care Plan section) Acute Rehab PT Goals Patient Stated Goal: None stated PT Goal  Formulation: With patient Time For Goal Achievement: 12/17/14 Potential to Achieve Goals: Good    Frequency Min 4X/week   Barriers to discharge   Family reports they will arrange as much assist as needed    Co-evaluation               End of Session Equipment Utilized During Treatment: Gait belt Activity Tolerance: Patient limited by fatigue;Other (comment) (Limited by dizziness) Patient left: in bed;with call bell/phone within reach;with bed alarm set;with family/visitor present Nurse Communication: Mobility status         Time: 6283-6629 PT Time Calculation (min) (ACUTE ONLY): 22 min   Charges:   PT Evaluation $Initial PT Evaluation Tier I: 1 Procedure     PT G CodesEllouise Newer 12/03/2014, 11:03 AM Elayne Snare, [place]

## 2014-12-03 NOTE — Progress Notes (Signed)
  Echocardiogram 2D Echocardiogram has been performed.  Laurie [name] 12/03/2014, 1:13 PM

## 2014-12-03 NOTE — Progress Notes (Signed)
Order for SLE received, will complete next date. Luanna Salk, [facility] SLP [phone]

## 2014-12-03 NOTE — Progress Notes (Signed)
Utilization Review Completed.Maxximus Gotay T10/10/2014  

## 2014-12-04 ENCOUNTER — Inpatient Hospital Stay (HOSPITAL_COMMUNITY): Payer: Commercial Managed Care - HMO

## 2014-12-04 DIAGNOSIS — I639 Cerebral infarction, unspecified: Secondary | ICD-10-CM

## 2014-12-04 DIAGNOSIS — I63131 Cerebral infarction due to embolism of right carotid artery: Secondary | ICD-10-CM

## 2014-12-04 LAB — BASIC METABOLIC PANEL
Anion gap: 9 (ref 5–15)
BUN: 11 mg/dL (ref 6–20)
CALCIUM: 8.2 mg/dL — AB (ref 8.9–10.3)
CO2: 26 mmol/L (ref 22–32)
Chloride: 103 mmol/L (ref 101–111)
Creatinine, Ser: 0.97 mg/dL (ref 0.44–1.00)
GFR calc Af Amer: 59 mL/min — ABNORMAL LOW (ref >60)
GFR, EST NON AFRICAN AMERICAN: 51 mL/min — AB (ref >60)
Glucose, Bld: 93 mg/dL (ref 65–99)
POTASSIUM: 3.6 mmol/L (ref 3.5–5.1)
SODIUM: 138 mmol/L (ref 135–145)

## 2014-12-04 LAB — HEMOGLOBIN A1C
Hgb A1c MFr Bld: 6.3 % — ABNORMAL HIGH (ref 4.8–5.6)
Mean Plasma Glucose: 134 mg/dL

## 2014-12-04 LAB — CK: CK TOTAL: 274 U/L — AB (ref 38–234)

## 2014-12-04 LAB — MAGNESIUM: MAGNESIUM: 2.3 mg/dL (ref 1.7–2.4)

## 2014-12-04 MED ORDER — POTASSIUM CHLORIDE CRYS ER 20 MEQ PO TBCR
40.0000 meq | EXTENDED_RELEASE_TABLET | Freq: Once | ORAL | Status: AC
  Administered 2014-12-04: 40 meq via ORAL
  Filled 2014-12-04: qty 2

## 2014-12-04 NOTE — Consult Note (Addendum)
Physical Medicine and Rehabilitation Consult Reason for Consult: Bilateral punctate subcortical infarct Referring Physician: [facility] right handed how   HPI: Laurie [name] is a 81 y.o. female with history of hypertension, coronary artery disease. Patient lives alone one level home 3-4 steps to entry independent prior to admission by report and her son assists with financial issues. Presented 12/02/2014 with altered mental status reports of gradual functional decline in the past 6 months. She was found on the floor outside of the bathroom by  family. MRI of the brain showed punctate acute to subacute embolic infarct in the parietal lobes and left frontal lobe. Chronic small vessel ischemic changes. MRA of the head with no evidence of large vessel occlusion. Echocardiogram with ejection fraction of 44% grade 2 diastolic dysfunction. Carotid Dopplers with no ICA stenosis. Neurology consulted maintained on aspirin for CVA prophylaxis as well as subcutaneous Lovenox for DVT prophylaxis. Physical and occupational therapy evaluations completed with recommendations of physical medicine rehabilitation consult.   Review of Systems  Constitutional: Negative for fever and chills.  HENT:       Occasional headaches  Eyes: Negative for double vision.  Respiratory: Negative for cough and shortness of breath.   Cardiovascular: Negative for chest pain, palpitations and leg swelling.  Gastrointestinal: Positive for constipation. Negative for nausea and vomiting.       GERD  Genitourinary: Positive for urgency and frequency. Negative for hematuria.       Bouts of bladder incontinence  Musculoskeletal: Positive for back pain.  Skin: Negative for rash.  Neurological: Positive for weakness. Negative for dizziness, seizures and loss of consciousness.  All other systems reviewed and are negative.  Past Medical History  Diagnosis Date  . Coronary artery disease     NONOBSTRUCTIVE WITH LVH AND PROMINENT  THEBESIAN VEINS, CURRENTLY WITHOUT CHEST PAIN  . SOB (shortness of breath)     WITH WALKING UP STAIRS  . Incontinence   . Back pain   . Asymmetric septal hypertrophy (HCC)   . Mitral regurgitation   . Pulmonary hypertension ([place])   . TIA (transient ischemic attack)   . Syncope and collapse     HISTORY SYNCOPE  . History of vein stripping   . Esophagitis   . Other nonspecific abnormal finding of lung field   . Benign neoplasm of colon 02/06/2005    Tubular adenomatous  . Diverticulosis of colon (without mention of hemorrhage)   . Complication of anesthesia     hard to sedate  . Hyperlipidemia     takes Lipitor daily  . Hypertension     takes Amlodipine and Metoprolol daily and Losartan  . History of bronchitis     yrs ago  . Headache(784.0)     occasionally  . Arthritis   . Joint pain     takes Anaprox prn  . Bruises easily   . GERD (gastroesophageal reflux disease)     takes Prilosec prn  . Constipation   . Urinary frequency   . Urinary urgency   . Hypothyroidism     takes Synthroid daily  . Macular degeneration, dry     mild   Past Surgical History  Procedure Laterality Date  . Cataract extraction, bilateral    . Lung biopsy    . Cardiac catheterization  09/2008  . Bil wrist surgery      d/t tendonitis  . Vein stripping in both legs    . Dilation and curettage of uterus    .  Colonoscopy    . Video bronchoscopy with endobronchial navigation Bilateral 04/12/2012    Procedure: VIDEO BRONCHOSCOPY WITH ENDOBRONCHIAL NAVIGATION;  Surgeon: Collene Gobble, MD;  Location: [facility];  Service: Thoracic;  Laterality: Bilateral;   Family History  Problem Relation Age of Onset  . Heart disease Mother   . Prostate cancer Father   . Hypertension Father    Social History:  reports that she has never smoked. She has never used smokeless tobacco. She reports that she does not drink alcohol or use illicit drugs. Allergies:  Allergies  Allergen Reactions  . Amoxicillin Swelling     Has patient had a PCN reaction causing immediate rash, facial/tongue/throat swelling, SOB or lightheadedness with hypotension: No Has patient had a PCN reaction causing severe rash involving mucus membranes or skin necrosis: No Has patient had a PCN reaction that required hospitalization No Has patient had a PCN reaction occurring within the last 10 years: No If all of the above answers are "NO", then may proceed with Cephalosporin use.  . Ativan [Lorazepam] Other (See Comments)    Causes hyperactiviity  . Benadryl [Diphenhydramine Hcl] Other (See Comments)    Causes hyperactivity  . Lodine [Etodolac] Hives   Medications Prior to Admission  Medication Sig Dispense Refill  . atorvastatin (LIPITOR) 20 MG tablet Take 20 mg by mouth at bedtime.    . budesonide-formoterol (SYMBICORT) 80-4.5 MCG/ACT inhaler Inhale 2 puffs into the lungs 2 (two) times daily. (Patient taking differently: Inhale 2 puffs into the lungs at bedtime as needed (shortness of breath/coughing). ) 1 Inhaler 5  . levothyroxine (SYNTHROID, LEVOTHROID) 88 MCG tablet Take 88 mcg by mouth at bedtime.    Marland Kitchen losartan (COZAAR) 100 MG tablet Take 1 tablet (100 mg total) by mouth daily. (Patient taking differently: Take 100 mg by mouth at bedtime. ) 90 tablet 3  . metoprolol succinate (TOPROL-XL) 100 MG 24 hr tablet Take 1 tablet (100 mg total) by mouth daily. (Patient taking differently: Take 100 mg by mouth at bedtime. )    . mirtazapine (REMERON) 15 MG tablet Take 15 mg by mouth at bedtime.    Marland Kitchen amLODipine (NORVASC) 2.5 MG tablet Take 1 tablet (2.5 mg total) by mouth daily. (Patient not taking: Reported on 12/02/2014)    . traMADol (ULTRAM) 50 MG tablet One every 4 hours as needed for pain or cough (Patient not taking: Reported on 12/02/2014) 40 tablet 0    Home: [place] expects to be discharged to:: Inpatient rehab Living Arrangements: Alone Available Help at Discharge: Family, Available 24 hours/day Type of  Home: House Home Access: Stairs to enter [employer] of Steps: 3-4 Entrance Stairs-Rails: Right, Left Home Layout: One level Home Equipment: None Additional Comments: Information provided from son  Lives With: Alone  Functional History: Prior Function Level of Independence: Needs assistance ADL's / Homemaking Assistance Needed: family brought pt groceries. Pt independent with ADLs Comments: Son reports pt was independent but noticed some short-term memory loss recently (past few months) Functional Status:  Mobility: Bed Mobility Overal bed mobility: Needs Assistance Bed Mobility: Sit to Supine Rolling: Mod assist (supervision-Min assist at times) Sidelying to sit: Mod assist, HOB elevated Supine to sit: Min assist Sit to supine: Supervision Sit to sidelying: Mod assist General bed mobility comments: MD came in to speak with pt-OT adjusted pt's legs better Transfers Overall transfer level: Needs assistance Equipment used: Rolling walker (2 wheeled) Transfers: Sit to/from Stand Sit to Stand: Mod assist General transfer comment: assist  to boost.  Ambulation/Gait Ambulation/Gait assistance: +2 physical assistance, Mod assist Ambulation Distance (Feet): 15 Feet Assistive device: 2 person hand held assist Gait Pattern/deviations: Step-through pattern, Decreased stride length, Leaning posteriorly, Narrow base of support, Scissoring (leaning posteriorly and to R) General Gait Details: Pt with tendency to lean posteriorly to the R, requiring increased physical assistance on this side.  PT providing multimodal cueing to maintain gait speed, upright posture, and head up.  Pt with narrow base of support and occassionally stepping on her own feet.  Pt with decreased ability to dorsiflex R ankle.  Some truncal ataxia noted as well. Gait velocity: slow Gait velocity interpretation: Below normal speed for age/gender    ADL: ADL Overall ADL's : Needs assistance/impaired Grooming:  Wash/dry face, Oral care, Minimal assistance, Standing Grooming Details (indicate cue type and reason): for balance Upper Body Bathing: Minimal assitance, Standing Upper Body Bathing Details (indicate cue type and reason): washed armpits Lower Body Bathing: Moderate assistance (standing/sitting) Upper Body Dressing : Set up, Supervision/safety, Sitting (supported) Upper Body Dressing Details (indicate cue type and reason): OT assisted with gown while standing Lower Body Dressing: Maximal assistance, Sit to/from stand Toilet Transfer: Ambulation, RW (sit to stand from chair and bed (mod assist); Mod-Max ass) Functional mobility during ADLs: Rolling walker (Mod-Max assist ) General ADL Comments: Pt able to perform some ADLs standing at sink today-decreased balance.   Cognition: Cognition Overall Cognitive Status: Impaired/Different from baseline Arousal/Alertness: Awake/alert Orientation Level: Oriented X4 Attention: Sustained, Selective Sustained Attention: Appears intact Selective Attention: Impaired Selective Attention Impairment: Verbal basic, Functional basic Memory: Impaired Memory Impairment: Storage deficit, Retrieval deficit, Decreased recall of new information, Decreased long term memory Decreased Long Term Memory: Verbal basic Awareness: Impaired Awareness Impairment: Anticipatory impairment, Intellectual impairment Problem Solving: Impaired Problem Solving Impairment: Functional basic Safety/Judgment: Impaired Cognition Arousal/Alertness: Awake/alert Behavior During Therapy: WFL for tasks assessed/performed Overall Cognitive Status: Impaired/Different from baseline Area of Impairment: Orientation, Memory Orientation Level: Disoriented to, Place, Time Memory: Decreased short-term memory Following Commands: Follows one step commands with increased time Problem Solving: Decreased initiation, Slow processing, Requires verbal cues  Blood pressure 186/72, pulse 80,  temperature 99.3 F (37.4 C), temperature source Oral, resp. rate 16, height 5\' 4"  (1.626 m), weight 58.968 kg (130 lb), SpO2 92 %. Physical Exam  Vitals reviewed. Constitutional: She appears well-developed and well-nourished.  HENT:  Head: Normocephalic.  External ears normal  Eyes: Conjunctivae and EOM are normal.  Neck: Normal range of motion. Neck supple. No thyromegaly present.  Cardiovascular: Normal rate and regular rhythm.   Respiratory: Effort normal and breath sounds normal. No respiratory distress.  GI: Soft. Bowel sounds are normal. She exhibits no distension.  Musculoskeletal:  Strength b/l UE 4/5 B/l LE 4/5 hip flexion, 4+ ankle dorsi/plantar flextion  Neurological: She is alert. She displays abnormal reflex (Brisk throughout). No cranial nerve deficit.  Makes good eye contact with examiner. Patient is a poor historian. She was able to provide her age but not date of birth or place. Followed simple commands Sensation to light touch intact throughout Neg Homan's Non-up going Babinski b/l No ataxis  Skin: Skin is warm and dry.  Psychiatric: She has a normal mood and affect. Her behavior is normal.    Results for orders placed or performed during the hospital encounter of 12/02/14 (from the past 24 hour(s))  CK     Status: Abnormal   Collection Time: 12/04/14  5:53 AM  Result Value Ref Range   Total CK 274 (H)  38 - 234 U/L  Basic metabolic panel     Status: Abnormal   Collection Time: 12/04/14  5:53 AM  Result Value Ref Range   Sodium 138 135 - 145 mmol/L   Potassium 3.6 3.5 - 5.1 mmol/L   Chloride 103 101 - 111 mmol/L   CO2 26 22 - 32 mmol/L   Glucose, Bld 93 65 - 99 mg/dL   BUN 11 6 - 20 mg/dL   Creatinine, Ser 0.97 0.44 - 1.00 mg/dL   Calcium 8.2 (L) 8.9 - 10.3 mg/dL   GFR calc non Af Amer 51 (L) >60 mL/min   GFR calc Af Amer 59 (L) >60 mL/min   Anion gap 9 5 - 15  Magnesium     Status: None   Collection Time: 12/04/14  5:53 AM  Result Value Ref Range    Magnesium 2.3 1.7 - 2.4 mg/dL   No results found.  Assessment/Plan: Diagnosis: Bilateral punctate subcortical infarct Labs and images independently reviewed.  Old records reviewed and summated above. Stroke:  Continue secondary stroke prophylaxis and Risk Factor Modification listed below:     Risk Factor Modification:  Antiplatelet therapy:  ASA  Blood Pressure Management:  Continue current medication with prn's with permisive HTN per   primary team (duration and range).   Statin Agent:  Atorvastatin  PT/OT for mobility, ADL training    1. Does the need for close, 24 hr/day medical supervision in concert with the patient's rehab needs make it unreasonable for this patient to be served in a less intensive setting? Yes 2. Co-Morbidities requiring supervision/potential complications: HTN, pain, anemia, hypothyroidism 3. Due to bladder management, safety, skin/wound care, disease management, medication administration, pain management and patient education, does the patient require 24 hr/day rehab nursing? Yes 4. Does the patient require coordinated care of a physician, rehab nurse, PT (1.5-2 hrs/day, 5 days/week) and OT (1.5-2 hrs/day, 5 days/week) to address physical and functional deficits in the context of the above medical diagnosis(es)? Potentially Addressing deficits in the following areas: balance, endurance, locomotion, strength, transferring, bowel/bladder control, bathing, dressing, feeding, grooming, toileting and psychosocial support 5. Can the patient actively participate in an intensive therapy program of at least 3 hrs of therapy per day at least 5 days per week? Potentially 6. The potential for patient to make measurable gains while on inpatient rehab is good 7. Anticipated functional outcomes upon discharge from inpatient rehab are supervision and min assist  with PT, supervision and min assist with OT, modified independent and supervision with SLP. 8. Estimated rehab length of  stay to reach the above functional goals is: 7-10 days. 9. Does the patient have adequate social supports and living environment to accommodate these discharge functional goals? Potentially 10. Anticipated D/C setting: Home 11. Anticipated post D/C treatments: HH therapy and Home excercise program 12. Overall Rehab/Functional Prognosis: good  RECOMMENDATIONS: This patient's condition is appropriate for continued rehabilitative care in the following setting: CIR Patient has agreed to participate in recommended program. Yes Note that insurance prior authorization may be required for reimbursement for recommended care.  Comment: Rehab Admissions Coordinator to follow up.  TEE remains pending.     Delice Lesch, MD 12/04/2014

## 2014-12-04 NOTE — Progress Notes (Signed)
VASCULAR LAB PRELIMINARY  PRELIMINARY  PRELIMINARY  PRELIMINARY  Bilateral lower extremity venous duplex completed.    Preliminary report:  There is no DVT or SVT noted in the bilateral lower extremities.   Chelsea Pedretti, RVT 12/04/2014, 10:04 AM

## 2014-12-04 NOTE — Evaluation (Signed)
Clinical/Bedside Swallow Evaluation Patient Details  Name: Laurie [name] MRN: [id number] Date of Birth: May 01, 1924  Today's Date: 12/04/2014 Time: SLP Start Time (ACUTE ONLY): 4562 SLP Stop Time (ACUTE ONLY): 1350 SLP Time Calculation (min) (ACUTE ONLY): 12 min  Past Medical History:  Past Medical History  Diagnosis Date  . Coronary artery disease     NONOBSTRUCTIVE WITH LVH AND PROMINENT THEBESIAN VEINS, CURRENTLY WITHOUT CHEST PAIN  . SOB (shortness of breath)     WITH WALKING UP STAIRS  . Incontinence   . Back pain   . Asymmetric septal hypertrophy (HCC)   . Mitral regurgitation   . Pulmonary hypertension ([place])   . TIA (transient ischemic attack)   . Syncope and collapse     HISTORY SYNCOPE  . History of vein stripping   . Esophagitis   . Other nonspecific abnormal finding of lung field   . Benign neoplasm of colon 02/06/2005    Tubular adenomatous  . Diverticulosis of colon (without mention of hemorrhage)   . Complication of anesthesia     hard to sedate  . Hyperlipidemia     takes Lipitor daily  . Hypertension     takes Amlodipine and Metoprolol daily and Losartan  . History of bronchitis     yrs ago  . Headache(784.0)     occasionally  . Arthritis   . Joint pain     takes Anaprox prn  . Bruises easily   . GERD (gastroesophageal reflux disease)     takes Prilosec prn  . Constipation   . Urinary frequency   . Urinary urgency   . Hypothyroidism     takes Synthroid daily  . Macular degeneration, dry     mild   Past Surgical History:  Past Surgical History  Procedure Laterality Date  . Cataract extraction, bilateral    . Lung biopsy    . Cardiac catheterization  09/2008  . Bil wrist surgery      d/t tendonitis  . Vein stripping in both legs    . Dilation and curettage of uterus    . Colonoscopy    . Video bronchoscopy with endobronchial navigation Bilateral 04/12/2012    Procedure: VIDEO BRONCHOSCOPY WITH ENDOBRONCHIAL NAVIGATION;  Surgeon: Collene Gobble, MD;  Location: [facility];  Service: Thoracic;  Laterality: Bilateral;   HPI:  Laurie [name] is a 81 y.o. female with a past medical history of hypertension, dyslipidemia, coronary artery disease, admitted after being found on the ground by daughter. MRI revealed Punctate, acute to subacute embolic infarcts in the parietal lobes and left frontal lobe.   Assessment / Plan / Recommendation Clinical Impression  Bedside swallow evaluation complete.  Patient consumed regular textures and thin liquids with no overt s/s of aspiration; however, minimal amounts of PO were consumed due to patient being full from lunch.  Daughter-in-law reported being present for most of the day and that the only observed difficulty patient had was with medications.  SLP educated both patient, family and RN that meds whole in puree would be recommended.  If s/s persist with medication administration a GI consult may be warranted due to history of GERD.  No further skilled SLP services warranted for dysphagia at this time.     Aspiration Risk  Mild   Diet Recommendation Age appropriate regular solids;Thin   Medication Administration: Whole meds with puree Compensations: Slow rate;Small sips/bites    Other  Recommendations Oral Care Recommendations: Oral care BID   Follow Up Recommendations  24 hour supervision/assistance;Inpatient Rehab    Frequency and Duration min 2x/week      Pertinent Vitals/Pain None   Swallow Study Prior Functional Status  Cognitive/Linguistic Baseline: Baseline deficits Baseline deficit details: memory and complex problem sovign family checked in daily and son managed finances  Type of Home: House  Lives With: Alone Available Help at Discharge: Family;Available 24 hours/day Vocation: Volunteer work    [employer] Other Pertinent Information: Laurie [name] is a 81 y.o. female with a past medical history of hypertension, dyslipidemia, coronary artery disease, admitted after being found on  the ground by daughter. MRI revealed Punctate, acute to subacute embolic infarcts in the parietal lobes and left frontal lobe. Type of Study: Bedside swallow evaluation Previous Swallow Assessment: none on record Diet Prior to this Study: Regular;Thin liquids Temperature Spikes Noted: No Respiratory Status: Room air History of Recent Intubation: No Behavior/Cognition: Alert;Cooperative;Pleasant mood;Confused;Distractible;Requires cueing Self-Feeding Abilities: Able to feed self Patient Positioning: Upright in chair/Tumbleform Baseline Vocal Quality: Normal Volitional Cough: Strong Volitional Swallow: Able to elicit    Oral/Motor/Sensory Function Overall Oral Motor/Sensory Function: Appears within functional limits for tasks assessed   Ice Chips Ice chips: Not tested   Thin Liquid Thin Liquid: Within functional limits Presentation: Cup;Straw;Self Fed    Nectar Thick Nectar Thick Liquid: Not tested   Honey Thick Honey Thick Liquid: Not tested   Puree Puree: Not tested   Solid   GO    Solid: Within functional limits Presentation: Self Fed       [name], M.A., CCC-SLP [phone]  Angello Chien 12/04/2014,2:43 PM

## 2014-12-04 NOTE — Evaluation (Signed)
Speech Language Pathology Evaluation Patient Details Name: Laurie [name] MRN: [id number] DOB: June 20, 1924 Today's Date: 12/04/2014 Time: 7893-8101 SLP Time Calculation (min) (ACUTE ONLY): 25 min  Problem List:  Patient Active Problem List   Diagnosis Date Noted  . Essential hypertension   . CVA (cerebral infarction) 12/02/2014  . Dyslipidemia 12/02/2014  . Hypokalemia 12/02/2014  . Dizziness 12/02/2014  . CVA (cerebral vascular accident) ([place]) 12/02/2014  . Cough 11/14/2012  . Atypical chest pain 11/14/2012  . Pulmonary infiltrates 02/06/2012  . Wheeze 12/27/2011  . Hypertension   . GERD (gastroesophageal reflux disease)   . Coronary artery disease   . SOB (shortness of breath)   . Incontinence   . Back pain   . Headache(784.0)   . Asymmetric septal hypertrophy (HCC)   . Mitral regurgitation   . Pulmonary hypertension ([place])   . TIA (transient ischemic attack)   . Syncope and collapse   . History of vein stripping   . Esophagitis   . Hypothyroidism 12/30/2006  . HYPERLIPIDEMIA 12/30/2006  . BRONCHIECTASIS 12/30/2006   Past Medical History:  Past Medical History  Diagnosis Date  . Coronary artery disease     NONOBSTRUCTIVE WITH LVH AND PROMINENT THEBESIAN VEINS, CURRENTLY WITHOUT CHEST PAIN  . SOB (shortness of breath)     WITH WALKING UP STAIRS  . Incontinence   . Back pain   . Asymmetric septal hypertrophy (HCC)   . Mitral regurgitation   . Pulmonary hypertension ([place])   . TIA (transient ischemic attack)   . Syncope and collapse     HISTORY SYNCOPE  . History of vein stripping   . Esophagitis   . Other nonspecific abnormal finding of lung field   . Benign neoplasm of colon 02/06/2005    Tubular adenomatous  . Diverticulosis of colon (without mention of hemorrhage)   . Complication of anesthesia     hard to sedate  . Hyperlipidemia     takes Lipitor daily  . Hypertension     takes Amlodipine and Metoprolol daily and Losartan  . History of bronchitis    yrs ago  . Headache(784.0)     occasionally  . Arthritis   . Joint pain     takes Anaprox prn  . Bruises easily   . GERD (gastroesophageal reflux disease)     takes Prilosec prn  . Constipation   . Urinary frequency   . Urinary urgency   . Hypothyroidism     takes Synthroid daily  . Macular degeneration, dry     mild   Past Surgical History:  Past Surgical History  Procedure Laterality Date  . Cataract extraction, bilateral    . Lung biopsy    . Cardiac catheterization  09/2008  . Bil wrist surgery      d/t tendonitis  . Vein stripping in both legs    . Dilation and curettage of uterus    . Colonoscopy    . Video bronchoscopy with endobronchial navigation Bilateral 04/12/2012    Procedure: VIDEO BRONCHOSCOPY WITH ENDOBRONCHIAL NAVIGATION;  Surgeon: Collene Gobble, MD;  Location: [facility];  Service: Thoracic;  Laterality: Bilateral;   HPI:  Laurie [name] is a 81 y.o. female with a past medical history of hypertension, dyslipidemia, coronary artery disease, admitted after being found on the ground by daughter. MRI revealed Punctate, acute to subacute embolic infarcts in the parietal lobes and left frontal lobe.   Assessment / Plan / Recommendation Clinical Impression    Cognitive-linguistic evaluation complete.  Speech clear and intelligible.  Montreal Cognitive Assessment administered and patient received a score of 9/30 with 26 or greater being [facility].  Patient and family report baseline deficits in the areas of memory; however, these deficits are now impacting her ability to solve basic problems safely.  Patient also reports difficulty retrieving words, but with increased time appeared functional.  Given overall performance on today's assessment skilled SLP services are warranted now and in the next level of care as well as 24/7 Supervision to maximize safety.  Results and recommendations shared with son and daughter-in-law.     SLP Assessment  Patient needs continued Speech  [place] [facility] Services    Follow Up Recommendations  24 hour supervision/assistance;Inpatient Rehab    Frequency and Duration min 2x/week  1 week   Pertinent Vitals/Pain Pain Assessment: No/denies pain   SLP Goals  Progression toward goals:  (Eval ) Patient/Family Stated Goal: none stated Potential to Achieve Goals (ACUTE ONLY): Fair Potential Considerations (ACUTE ONLY): Ability to learn/carryover information;Previous level of function  SLP Evaluation Prior Functioning  Cognitive/Linguistic Baseline: Baseline deficits Baseline deficit details: memory and complex problem sovign family checked in daily and son managed finances  Type of Home: House  Lives With: Alone Available Help at Discharge: Family;Available 24 hours/day Vocation: Volunteer work   [employer]  Overall Cognitive Status: Impaired/Different from baseline Arousal/Alertness: Awake/alert Orientation Level: Oriented to person;Disoriented to place;Disoriented to time;Disoriented to situation Attention: Sustained;Selective Sustained Attention: Appears intact Selective Attention: Impaired Selective Attention Impairment: Verbal basic;Functional basic Memory: Impaired Memory Impairment: Storage deficit;Retrieval deficit;Decreased recall of new information;Decreased long term memory Decreased Long Term Memory: Verbal basic Awareness: Impaired Awareness Impairment: Anticipatory impairment;Intellectual impairment Problem Solving: Impaired Problem Solving Impairment: Functional basic Safety/Judgment: Impaired    Comprehension  Auditory Comprehension Overall Auditory Comprehension: Appears within functional limits for tasks assessed Other Conversation Comments: pt described some anomia but none observed  Interfering Components: Attention;Working Curator: Within [employer] Reading Comprehension Reading Status: Not tested    Expression Expression Primary  Mode of Expression: Verbal Verbal Expression Overall Verbal Expression: Appears within functional limits for tasks assessed Other Verbal Expression Comments: pt described some anomia but none observed  Written Expression Dominant Hand: Right Written Expression: Within Functional Limits   Oral / Motor Oral Motor/Sensory Function Overall Oral Motor/Sensory Function: Appears within functional limits for tasks assessed Motor Speech Overall Motor Speech: Appears within functional limits for tasks assessed   GO     Carmelia Roller., CCC-SLP [phone]  Dekota Kirlin 12/04/2014, 2:37 PM

## 2014-12-04 NOTE — Progress Notes (Signed)
[facility] PROGRESS NOTE  Laurie [name] FGH:[id number] DOB: 13-Dec-1924 DOA: 12/02/2014 PCP: Geoffery Lyons, MD  Subjective: Today the patient is doing well. Speech/mood appropriate and she is able to move all extremities. She denies fever, chills, N/V/D, or abdominal pain.   Assessment/Plan: Principal Problem:  CVA: MRI in ED indicate punctate acute embolic infarcts in parietal and left lobes. Carotid doppler showed 1-39% ICA plaquing. Echo showed EF of 67-89% with no embolic source. Tele/EKG do not indicate Afib. LE venous dopplers negative. Patient/family consulted and is agreeable to continue with TEE and possible loop (schedueld for 12/05/14). LDL 68 (see below). HgbA1c pending. Continue ASA 325 and await additional recommendations from neurology.   Active Problems:  Delirium: Likely related to CVA/[place]. Patient is currently at baseline, pleasant. Continue to monitor.   Dementia: Chronic, patient is currently at baseline, pleasant. Continue supportive care and monitor.   Dyslipidemia: Chronic, managed at home with Lipitor 20 mg. LDL 68 (at goal), continue at-home statin.   Hypothyroidism: Chronic, managed at home with Synthroid. Continue at home therapy.  Essential HTN: Chronic, managed at home with Norvasc, metoprolol, and losartan. Current BP stable (highest at 170/65) with metoprolol. Continue metoprolol and monitor. Consider resumption of Losartan and Amlodipine in next few days if worsening.   Mild Rhabdomyolysis: CK 780 on admission, secondary to prolonged time on floor. IVF initiated on admission and CK improving (currently 274). IVF discontinued d/t CHF history. Continue to monitor.   Hypertensive Heart Disease: Chronic, mild focal septal hypertrophy in 2013 (not HOCM, but instead LVH from HTN). Echo in hospital revealed EF of 55-60% with moderate focal basal and hypertrophy of left ventricle and grade 2 diastolic dysfunction. Continue to monitor.    Chronic Diastolic  CHF: Chronic, compensated. IVF discontinued. No evidence of fluid overload today. Continue to monitor.   History of Bronchiectasis: Stable, no evidence of wheeze or crackles. PRN bronchodilators available. Continue to monitor.   Code Status: DNR Family Communication: Daughters at bedside  Disposition Plan: CIR consult pending    Consultants:  Neurology  CIR   Procedures:  TEE (schedueld for 12/05/14)  Antibiotics: None     Objective: Filed Vitals:   12/04/14 0511  BP: 132/64  Pulse: 74  Temp: 97.9 F (36.6 C)  Resp: 18   No intake or output data in the 24 hours ending 12/04/14 1011 Filed Weights   12/02/14 1304  Weight: 58.968 kg (130 lb)    Exam:   General:  Well-appearing woman, laying in bed with no acute distress  Cardiovascular: RRR, no m/r/g. No peripheral edema.   Respiratory: CTA b/l, no wheeze or crackles  Abdomen: Soft, non-tender, non distended. + BS  Neuro: A&Ox3, no focal neurological deficits  Data Reviewed: Basic Metabolic Panel:  Recent Labs Lab 12/02/14 1330 12/03/14 0656 12/04/14 0553  NA 136 137 138  K 3.2* 4.4 3.6  CL 98* 101 103  CO2 28 25 26   GLUCOSE 134* 96 93  BUN 8 10 11   CREATININE 0.97 0.98 0.97  CALCIUM 8.4* 8.2* 8.2*  MG  --   --  2.3   Liver Function Tests:  Recent Labs Lab 12/02/14 1330  AST 28  ALT 13*  ALKPHOS 80  BILITOT 0.7  PROT 6.4*  ALBUMIN 2.2*   No results for input(s): LIPASE, AMYLASE in the last 168 hours. No results for input(s): AMMONIA in the last 168 hours. CBC:  Recent Labs Lab 12/02/14 1330 12/03/14 0656  WBC 9.9 10.2  NEUTROABS 8.5*  --  HGB 12.0 11.9*  HCT 38.2 37.0  MCV 86.6 86.4  PLT 246 208   Cardiac Enzymes:  Recent Labs Lab 12/02/14 1330 12/03/14 1209 12/04/14 0553  CKTOTAL 780* 692* 274*  CKMB 4.7  --   --    BNP (last 3 results) No results for input(s): BNP in the last 8760 hours.  ProBNP (last 3 results) No results for input(s): PROBNP in the last  8760 hours.  CBG: No results for input(s): GLUCAP in the last 168 hours.  No results found for this or any previous visit (from the past 240 hour(s)).   Studies: Mr Virgel Paling Wo Contrast  12/02/2014   CLINICAL DATA:  Vertigo in generalized weakness.  EXAM: MRI HEAD WITHOUT CONTRAST  MRA HEAD WITHOUT CONTRAST  TECHNIQUE: Multiplanar, multiecho pulse sequences of the brain and surrounding structures were obtained without intravenous contrast. Angiographic images of the head were obtained using MRA technique without contrast.  COMPARISON:  Head CT 09/19/2013  FINDINGS: MRI HEAD FINDINGS  Some sequences are mildly to moderately motion degraded. There is a punctate focus of cortical restricted diffusion in the right parietal lobe. There is a punctate focus of abnormal diffusion weighted signal in the left parietal cortex without clear restricted diffusion, however evaluation is limited by its small size. A similar punctate focus of diffusion abnormality is present in the subcortical white matter of the left superior frontal gyrus. There is no acute or subacute large territory infarct.  There is likely a focus of chronic microhemorrhage in the left parietal lobe. There is mild generalized cerebral atrophy. T2 hyperintensities in the subcortical and deep cerebral white matter are nonspecific but compatible with mild chronic small vessel ischemic disease. There may be a tiny chronic infarct in the left cerebellum. No mass, midline shift, or extra-axial fluid collection is seen.  Prior bilateral cataract extraction is noted. No significant inflammatory disease is seen in the paranasal sinuses or mastoid air cells. Major intracranial vascular flow voids are grossly preserved.  MRA HEAD FINDINGS  The study is moderately to severely motion degraded. The visualized distal vertebral arteries appear patent with the right being dominant. Left vertebral artery is particularly hypoplastic distal to the PICA origin. Basilar  artery is patent without gross high-grade stenosis, although evaluation is limited by motion. Flow is present in the PCAs with limited evaluation for stenosis due to motion.  Internal carotid arteries are patent from skullbase to carotid termini without evidence of flow limiting stenosis. A1 and M1 segments are patent. Evaluation for stenosis and evaluation of M2 and A2 segments cannot be adequately performed due to motion artifact.  IMPRESSION: 1. Punctate, acute to subacute embolic infarcts in the parietal lobes and left frontal lobe. 2. Mild cerebral atrophy and chronic small vessel ischemic disease. 3. Moderately to severely motion degraded MRA. No evidence of large vessel occlusion.   Electronically Signed   By: Logan Bores M.D.   On: 12/02/2014 18:09   Mr Brain Wo Contrast  12/02/2014   CLINICAL DATA:  Vertigo in generalized weakness.  EXAM: MRI HEAD WITHOUT CONTRAST  MRA HEAD WITHOUT CONTRAST  TECHNIQUE: Multiplanar, multiecho pulse sequences of the brain and surrounding structures were obtained without intravenous contrast. Angiographic images of the head were obtained using MRA technique without contrast.  COMPARISON:  Head CT 09/19/2013  FINDINGS: MRI HEAD FINDINGS  Some sequences are mildly to moderately motion degraded. There is a punctate focus of cortical restricted diffusion in the right parietal lobe. There is a punctate focus  of abnormal diffusion weighted signal in the left parietal cortex without clear restricted diffusion, however evaluation is limited by its small size. A similar punctate focus of diffusion abnormality is present in the subcortical white matter of the left superior frontal gyrus. There is no acute or subacute large territory infarct.  There is likely a focus of chronic microhemorrhage in the left parietal lobe. There is mild generalized cerebral atrophy. T2 hyperintensities in the subcortical and deep cerebral white matter are nonspecific but compatible with mild chronic  small vessel ischemic disease. There may be a tiny chronic infarct in the left cerebellum. No mass, midline shift, or extra-axial fluid collection is seen.  Prior bilateral cataract extraction is noted. No significant inflammatory disease is seen in the paranasal sinuses or mastoid air cells. Major intracranial vascular flow voids are grossly preserved.  MRA HEAD FINDINGS  The study is moderately to severely motion degraded. The visualized distal vertebral arteries appear patent with the right being dominant. Left vertebral artery is particularly hypoplastic distal to the PICA origin. Basilar artery is patent without gross high-grade stenosis, although evaluation is limited by motion. Flow is present in the PCAs with limited evaluation for stenosis due to motion.  Internal carotid arteries are patent from skullbase to carotid termini without evidence of flow limiting stenosis. A1 and M1 segments are patent. Evaluation for stenosis and evaluation of M2 and A2 segments cannot be adequately performed due to motion artifact.  IMPRESSION: 1. Punctate, acute to subacute embolic infarcts in the parietal lobes and left frontal lobe. 2. Mild cerebral atrophy and chronic small vessel ischemic disease. 3. Moderately to severely motion degraded MRA. No evidence of large vessel occlusion.   Electronically Signed   By: Logan Bores M.D.   On: 12/02/2014 18:09    Scheduled Meds: .  stroke: mapping our early stages of recovery book   Does not apply Once  . aspirin  300 mg Rectal Daily   Or  . aspirin  325 mg Oral Daily  . atorvastatin  20 mg Oral Daily  . enoxaparin (LOVENOX) injection  40 mg Subcutaneous Q24H  . levothyroxine  88 mcg Oral QAC breakfast  . metoprolol succinate  100 mg Oral Daily  . potassium chloride  40 mEq Oral Once   Continuous Infusions:   Principal Problem:   CVA (cerebral infarction) Active Problems:   Hypothyroidism   Pulmonary hypertension (HCC)   Dyslipidemia   Hypokalemia    Dizziness   CVA (cerebral vascular accident) ([place])   Essential hypertension       Laurie [name], Student-PA  [facility] If 7PM-7AM, please contact night-coverage at [email or web address], password [email or web address] 12/04/2014, 10:12 AM  LOS: 2 days

## 2014-12-04 NOTE — Progress Notes (Signed)
STROKE TEAM PROGRESS NOTE   SUBJECTIVE (INTERVAL HISTORY) Her daughter-in-law is at the bedside. Patient up in chair at bedside. Case discussed with dtr-in-law. She seems much better than yesterday. TEE planned in am with potential loop recorder.    OBJECTIVE Temp:  [97.9 F (36.6 C)-99.5 F (37.5 C)] 97.9 F (36.6 C) (10/10 0511) Pulse Rate:  [60-89] 74 (10/10 0511) Cardiac Rhythm:  [-] Normal sinus rhythm (10/10 0759) Resp:  [17-19] 18 (10/10 0511) BP: (132-161)/(64-74) 132/64 mmHg (10/10 0511) SpO2:  [91 %-96 %] 96 % (10/10 0511)  No results for input(s): GLUCAP in the last 168 hours.  Recent Labs Lab 12/02/14 1330 12/03/14 0656 12/04/14 0553  NA 136 137 138  K 3.2* 4.4 3.6  CL 98* 101 103  CO2 28 25 26   GLUCOSE 134* 96 93  BUN 8 10 11   CREATININE 0.97 0.98 0.97  CALCIUM 8.4* 8.2* 8.2*  MG  --   --  2.3    Recent Labs Lab 12/02/14 1330  AST 28  ALT 13*  ALKPHOS 80  BILITOT 0.7  PROT 6.4*  ALBUMIN 2.2*    Recent Labs Lab 12/02/14 1330 12/03/14 0656  WBC 9.9 10.2  NEUTROABS 8.5*  --   HGB 12.0 11.9*  HCT 38.2 37.0  MCV 86.6 86.4  PLT 246 208    Recent Labs Lab 12/02/14 1330 12/03/14 1209 12/04/14 0553  CKTOTAL 780* 692* 274*  CKMB 4.7  --   --     Recent Labs  12/02/14 1330  LABPROT 15.3*  INR 1.19    Recent Labs  12/02/14 1423  COLORURINE YELLOW  LABSPEC 1.010  PHURINE 7.5  GLUCOSEU NEGATIVE  HGBUR SMALL*  BILIRUBINUR NEGATIVE  KETONESUR NEGATIVE  PROTEINUR NEGATIVE  UROBILINOGEN 1.0  NITRITE NEGATIVE  LEUKOCYTESUR NEGATIVE       Component Value Date/Time   CHOL 125 12/03/2014 0656   TRIG 93 12/03/2014 0656   HDL 38* 12/03/2014 0656   CHOLHDL 3.3 12/03/2014 0656   VLDL 19 12/03/2014 0656   LDLCALC 68 12/03/2014 0656   Lab Results  Component Value Date   HGBA1C 6.3* 12/03/2014   No results found for: LABOPIA, COCAINSCRNUR, LABBENZ, AMPHETMU, THCU, LABBARB  No results for input(s): ETH in the last 168 hours.  I  have personally reviewed the radiological images below and agree with the radiology interpretations.  Mri and Mra Head Wo Contrast   12/02/2014   1. Punctate, acute to subacute embolic infarcts in the parietal lobes and left frontal lobe. 2. Mild cerebral atrophy and chronic small vessel ischemic disease. 3. Moderately to severely motion degraded MRA. No evidence of large vessel occlusion.   Carotid Doppler  Bilateral: 1-39% ICA stenosis. Vertebral artery flow is antegrade.  2D Echocardiogram   - Left ventricle: The cavity size was normal. There was moderatefocal basal and mild concentric hypertrophy of the leftventricle. Systolic function was normal. The estimated ejectionfraction was in the range of 55% to 60%. Wall motion was normal;there were no regional wall motion abnormalities. Features areconsistent with a pseudonormal left ventricular filling pattern,with concomitant abnormal relaxation and increased fillingpressure (grade 2 diastolic dysfunction). There was no evidenceof elevated ventricular filling pressure by Doppler parameters. - Aortic valve: Trileaflet; normal thickness leaflets. There was mild regurgitation. - Aortic root: The aortic root was mildly dilated measurin 41 mm. - Ascending aorta: The ascending aorta was normal in size. - Mitral valve: Structurally normal valve. There was mild regurgitation. - Left atrium: The atrium was mildly dilated. -  Right ventricle: Systolic function was normal. - Right atrium: The atrium was normal in size. - Tricuspid valve: There was moderate regurgitation. - Pulmonic valve: There was mild regurgitation. - Pulmonary arteries: Systolic pressure was severely increased. PApeak pressure: 62 mm Hg (S). - Inferior vena cava: The vessel was normal in size. - Pericardium, extracardiac: There was no pericardial effusion.  TEE pending   Lower extremity venous duplex  There is no DVT or SVT noted in the bilateral lower extremities.     PHYSICAL EXAM General - Well nourished, well developed, no acute distress.  Ophthalmologic - not cooperative on fundi exam due to eye movement.  Cardiovascular - Regular rate and rhythm with no murmur.  Neck - supple, no carotid bruits  Mental Status -  Awake alert and orientation to place, and person were intact, but not to time. Language including expression, naming, repetition, comprehension was assessed and found intact. Fund of Knowledge was assessed and was impaired.  Cranial Nerves II - XII - II - Visual field intact OU. III, IV, VI - Extraocular movements intact. V - Facial sensation intact bilaterally. VII - Facial movement intact bilaterally. VIII - Hearing & vestibular intact bilaterally. X - Palate elevates symmetrically. XI - Chin turning & shoulder shrug intact bilaterally. XII - Tongue protrusion intact.  Motor Strength - The patient's strength was 4/5 in all extremities, symmetric and pronator drift was absent.  Bulk was normal and fasciculations were absent.   Motor Tone - Muscle tone was assessed at the neck and appendages and was normal.  Reflexes - The patient's reflexes were symmetrical in all extremities and she had no pathological reflexes.  Sensory - Light touch, temperature/pinprick were assessed and were symmetrical.    Coordination - The patient had normal movements in the hands with no ataxia or dysmetria.  Tremor was absent.  Gait and Station - not tested due to sleepiness.   ASSESSMENT/PLAN Ms. Laurie [name] is a 81 y.o. female with history of CAD, HTN, HLD, MCI admitted for a fall with complain of dizziness and not able to get up. Symptoms much improved.    Stroke:  Bilateral 3 punctate subcortical infarct, likely embolic secondary to unknown source, suspicious for afib  MRI  B/L 3 punctate subcortical infarct at MCA territories  MRA  Motion degraded, no large vessel cut off  Carotid Doppler  unremarkable  2D Echo  EF 55-60%  LE  venous dopplers negative  TEE scheduled for 10/12  loop recorder if TEE neg  LDL 68  HgbA1c 6.3  lovenox for VTE prophylaxis  Diet regular Room service appropriate?: Yes; Fluid consistency:: Thin   no antithrombotic prior to admission, now on aspirin 325 mg orally every day  Patient counseled to be compliant with her antithrombotic medications  Ongoing aggressive stroke risk factor management  Therapy recommendations:  CIR, consult pending   Disposition:  pending  Hypertension  Home meds:   Norvasc, metoprolol and losartan Permissive hypertension (OK if <220/120) for 24-48 hours post stroke and then gradually normalized within 5-7 days. Currently on metoprolol  Stable  Patient counseled to be compliant with her blood pressure medications  Hyperlipidemia  Home meds:  none   LDL 68, goal < 70  On the goal  Other Stroke Risk Factors  Advanced age  Coronary artery disease  Other Active Problems  Mild anemia  Elevated CK  Other Pertinent History  [facility] day # [facility] See  Amion for Pager information 12/04/2014 12:46 PM   I, the attending vascular neurologist, have personally obtained a history, examined the patient, evaluated laboratory data, individually viewed imaging studies and agree with radiology interpretations. I obtained additional history from pt's daughter in law at bedside. Together with the NP/PA, we formulated the assessment and plan of care which reflects our mutual decision.  I have made any additions or clarifications directly to the above note and agree with the findings and plan as currently documented.   81 yo F with hx of CAD, HTN, HLD, MCI admitted for a fall with complain of dizziness and not able to get up. MRI showed 3 punctate subcortical infarcts bilaterally, raising concern for afib. MRA, 2D echo, CUS, LDL and A1C all unremarkable. TEE and potential loop will be done tomorrow.  Continue ASA. PT/OT   Rosalin Hawking, MD PhD Stroke Neurology 12/04/2014 11:04 PM      To contact Stroke Continuity provider, please refer to [email or web address] After hours, contact General Neurology

## 2014-12-04 NOTE — Progress Notes (Signed)
Physical Therapy Treatment Patient Details Name: Laurie [name] MRN: [id number] DOB: 1924/06/22 Today's Date: 12/04/2014    History of Present Illness Marlena RASHAWN ROLON is a 81 y.o. female with a past medical history of hypertension, dyslipidemia, coronary artery disease, admitted after being found on the ground by daughter. MRI revealed Punctate, acute to subacute embolic infarcts in the parietal lobes and left frontal lobe.    PT Comments    Pt alert/oriented and willing to participate with PT.  She demonstrates improvements in functional mobility but continues to demonstrate significant limitations requiring further PT services.  Pt is able to perform sit to stand with BUE support of bed rail and Min assist.  Pt requires Mod assist +2 HHA with one PT behind and one in front to walk short distances due to limited ability to maintain neutral in standing.  Pt continues to demonstrate significant R posterior lean with ambulation and requiring frequent multimodal cueing for postural alignment.  PT in the back provided physical assistance and to maintain upright posture as well as to facilitate weight shifting to L side to prevent R posterior lean.  PT in front provided HHA and visual cues for pt to lean anteriorly and to keep head up during gait training.  PT and continues to recommend CIR following d/c.   Follow Up Recommendations  CIR     Equipment Recommendations   (TBD next venue of care)    Recommendations for Other Services Rehab consult     Precautions / Restrictions Precautions Precautions: Fall Restrictions Weight Bearing Restrictions: No    Mobility  Bed Mobility               General bed mobility comments: pt received sitting in chair  Transfers Overall transfer level: Needs assistance Equipment used:  (bed rail for UE support with sit to stand) Transfers: Sit to/from Stand Sit to Stand: Min assist (Min assist for sit to stand using bed rail for support)            Ambulation/Gait Ambulation/Gait assistance: +2 physical assistance;Mod assist Ambulation Distance (Feet): 15 Feet Assistive device: 2 person hand held assist Gait Pattern/deviations: Step-through pattern;Decreased stride length;Leaning posteriorly;Narrow base of support;Scissoring (leaning posteriorly and to R) Gait velocity: slow Gait velocity interpretation: Below normal speed for age/gender General Gait Details: Pt with tendency to lean posteriorly to the R, requiring increased physical assistance on this side.  PT providing multimodal cueing to maintain gait speed, upright posture, and head up.  Pt with narrow base of support and occassionally stepping on her own feet.  Pt with decreased ability to dorsiflex R ankle.  Some truncal ataxia noted as well.   Stairs            Wheelchair Mobility    Modified Rankin (Stroke Patients Only) Modified Rankin (Stroke Patients Only) Pre-Morbid Rankin Score: Slight disability Modified Rankin: Moderately severe disability     Balance Overall balance assessment: Needs assistance Sitting-balance support: No upper extremity supported;Feet supported Sitting balance-Leahy Scale: Fair     Standing balance support: Bilateral upper extremity supported;During functional activity Standing balance-Leahy Scale: Poor Standing balance comment: Requires B UE support to maintain standing.  Pt requiring frequent multimodal cues to maintain neutral postural alignment and to prevent R posterior lean.  Pt able to shift weight bilaterally and perform marching with B UE support.                    Cognition   Behavior During Therapy: [facility]  for tasks assessed/performed Overall Cognitive Status: Within Functional Limits for tasks assessed                      Exercises      General Comments General comments (skin integrity, edema, etc.): Pt reports mildly "swimmy headed" upon sitting and standing which improved relatively quickly.   Family member present and very supportive.      Pertinent Vitals/Pain Pain Assessment: No/denies pain    Home Living     Available Help at Discharge: Family;Available 24 hours/day Type of Home: House              Prior Function            PT Goals (current goals can now be found in the care plan section) Acute Rehab PT Goals Patient Stated Goal: not stated PT Goal Formulation: With patient Time For Goal Achievement: 12/17/14 Potential to Achieve Goals: Good Progress towards PT goals: Progressing toward goals    Frequency  Min 4X/week    PT Plan Current plan remains appropriate    Co-evaluation             End of Session Equipment Utilized During Treatment: Gait belt Activity Tolerance: Patient tolerated treatment well Patient left: in chair;with call bell/phone within reach;with family/visitor present     Time: 1430-1450 PT Time Calculation (min) (ACUTE ONLY): 20 min  Charges:  $Gait Training: 8-22 mins                    G Codes:      Jalynn Betzold 12-21-2014, 3:42 PM   Lorita Officer, SPT

## 2014-12-04 NOTE — Progress Notes (Signed)
SLP Cancellation Note  Patient Details Name: AVON MOLOCK MRN: [id number] DOB: Jan 02, 1925   Cancelled treatment:       Reason Eval/Treat Not Completed: Patient at procedure or test/unavailable.  SLP will follow up as able.  [name], M.A., CCC-SLP [phone]  [place] 12/04/2014, 9:33 AM

## 2014-12-04 NOTE — Progress Notes (Signed)
    [facility] has been requested to perform a transesophageal echocardiogram on Laurie [name] for stroke work up.  After careful review of history and examination, the risks and benefits of transesophageal echocardiogram have been explained including risks of esophageal damage, perforation (1:10,000 risk), bleeding, pharyngeal hematoma as well as other potential complications associated with conscious sedation including aspiration, arrhythmia, respiratory failure and death. Alternatives to treatment were discussed, questions were answered. The son, Summers Buendia, is willing to proceed.   Tarri Fuller, [place] 12/04/2014 1:21 PM

## 2014-12-04 NOTE — Progress Notes (Addendum)
Occupational Therapy Treatment Patient Details Name: Laurie [name] MRN: [id number] DOB: 1924-04-13 Today's Date: 12/04/2014    History of present illness Laurie [name] is a 81 y.o. female with a past medical history of hypertension, dyslipidemia, coronary artery disease, admitted after being found on the ground by daughter. MRI revealed Punctate, acute to subacute embolic infarcts in the parietal lobes and left frontal lobe.   OT comments  Pt progressing and able to walk to sink today and perform ADL tasks. Continue to recommend CIR for rehab.  Follow Up Recommendations  CIR    Equipment Recommendations  Other (comment) (defer to next venue)    Recommendations for Other Services      Precautions / Restrictions Precautions Precautions: Fall Restrictions Weight Bearing Restrictions: No       Mobility Bed Mobility Overal bed mobility: Needs Assistance Bed Mobility: Sit to Supine       Sit to supine: Supervision   General bed mobility comments: MD came in to speak with pt-OT adjusted pt's legs better  Transfers Overall transfer level: Needs assistance Equipment used: Rolling walker (2 wheeled) Transfers: Sit to/from Stand Sit to Stand: Mod assist         General transfer comment: assist to boost and for balance.     Balance Overall balance assessment: Needs assistance    Min-Max assist for balance at sink-performed functional tasks.       ADL Overall ADL's : Needs assistance/impaired     Grooming: Wash/dry face;Oral care;Minimal assistance;Standing Grooming Details (indicate cue type and reason): decreased balance Upper Body Bathing: Minimal assitance;Standing Upper Body Bathing Details (indicate cue type and reason): washed armpits Lower Body Bathing: Maximal assistance (standing/sitting)-assist with balance     Upper Body Dressing Details (indicate cue type and reason): OT/family member assisted with gown while standing     Toilet Transfer:  Ambulation;RW (sit to stand from chair and bed-mod assist); Mod assist for ambulation to sink and heavy assist to step backwards to bed Max assist and family member assisted as well.            Functional mobility during ADLs: Rolling walker (Mod-Max assist;family member assisted OT in helping pt step backwards to bed-heavy assist, otherwise pt Mod assist to ambulate to sink with RW) General ADL Comments: Pt able to perform some ADLs standing at sink today-decreased balance. Family member assisted OT with pt's balance during LB bathing and for pt stepping backwards to bed.       Vision                     Perception     Praxis      Cognition  Awake/Alert Behavior During Therapy: WFL for tasks assessed/performed Overall Cognitive Status: Impaired/Different from baseline Area of Impairment: Orientation;Memory Orientation Level: Disoriented to;Place;Time   Memory: Decreased short-term memory               Extremity/Trunk Assessment               Exercises     Shoulder Instructions       General Comments      Pertinent Vitals/ Pain       Pain Assessment: Faces Pain Score: 0-No pain  Home Living     Available Help at Discharge: Family;Available 24 hours/day Type of Home: House  Lives With: Alone    Prior Functioning/Environment              Frequency Min 2X/week     Progress Toward Goals  OT Goals(current goals can now be found in the care plan section)  Progress towards OT goals: Progressing toward goals  Acute Rehab OT Goals Patient Stated Goal: not stated OT Goal Formulation: With patient/family Time For Goal Achievement: 12/10/14 Potential to Achieve Goals: Good ADL Goals Pt Will Perform Grooming:  (3 tasks) Pt Will Perform Lower Body Bathing: with set-up;with supervision;sitting/lateral leans Pt Will Perform Lower Body Dressing: sitting/lateral leans;with set-up;with supervision Pt  Will Transfer to Toilet: with min assist;ambulating Pt Will Perform Toileting - Clothing Manipulation and hygiene: with set-up;sitting/lateral leans;with supervision  Plan Discharge plan remains appropriate    Co-evaluation                 End of Session Equipment Utilized During Treatment: Gait belt;Rolling walker   Activity Tolerance Patient tolerated treatment well   Patient Left in bed;with call bell/phone within reach;with family/visitor present;Other (comment) (MD in room)   Nurse Communication          Time: [phone] OT Time Calculation (min): 19 min  Charges: OT General Charges $OT Visit: 1 Procedure OT Treatments $Self Care/Home Management : 8-22 mins  Benito Mccreedy OTR/L [phone] 12/04/2014, 4:02 PM

## 2014-12-04 NOTE — Progress Notes (Signed)
Patient noted to be coughing frequently after sips of water. Patient repositioned, educated to take small sips, swallow twice. Patient continues to cough after sips of water. Per family, patient has had issues with swallowing prior to hospital stay but unsure if swallowing difficulties have worsened. MD notified, SLP bedside swallow evaluation ordered. Will continue to monitor patient.

## 2014-12-05 ENCOUNTER — Encounter (HOSPITAL_COMMUNITY)
Admission: EM | Disposition: A | Discharge status: Skilled Nursing Facility | Payer: Self-pay | Source: Home / Self Care | Attending: Internal Medicine

## 2014-12-05 ENCOUNTER — Other Ambulatory Visit: Payer: Self-pay | Admitting: Nurse Practitioner

## 2014-12-05 ENCOUNTER — Inpatient Hospital Stay (HOSPITAL_COMMUNITY): Payer: Commercial Managed Care - HMO

## 2014-12-05 ENCOUNTER — Encounter (HOSPITAL_COMMUNITY): Payer: Self-pay

## 2014-12-05 DIAGNOSIS — I6340 Cerebral infarction due to embolism of unspecified cerebral artery: Principal | ICD-10-CM

## 2014-12-05 DIAGNOSIS — I639 Cerebral infarction, unspecified: Secondary | ICD-10-CM

## 2014-12-05 DIAGNOSIS — I340 Nonrheumatic mitral (valve) insufficiency: Secondary | ICD-10-CM

## 2014-12-05 HISTORY — PX: TEE WITHOUT CARDIOVERSION: SHX5443

## 2014-12-05 SURGERY — ECHOCARDIOGRAM, TRANSESOPHAGEAL
Anesthesia: Moderate Sedation

## 2014-12-05 MED ORDER — FENTANYL CITRATE (PF) 100 MCG/2ML IJ SOLN
INTRAMUSCULAR | Status: DC | PRN
  Administered 2014-12-05: 25 ug via INTRAVENOUS

## 2014-12-05 MED ORDER — SODIUM CHLORIDE 0.9 % IV SOLN
INTRAVENOUS | Status: DC
  Administered 2014-12-05: 500 mL via INTRAVENOUS

## 2014-12-05 MED ORDER — AMLODIPINE BESYLATE 5 MG PO TABS
5.0000 mg | ORAL_TABLET | Freq: Every day | ORAL | Status: DC
  Administered 2014-12-06: 5 mg via ORAL
  Filled 2014-12-05 (×2): qty 1

## 2014-12-05 MED ORDER — BUTAMBEN-TETRACAINE-BENZOCAINE 2-2-14 % EX AERO
INHALATION_SPRAY | CUTANEOUS | Status: DC | PRN
  Administered 2014-12-05: 2 via TOPICAL

## 2014-12-05 MED ORDER — MIDAZOLAM HCL 5 MG/ML IJ SOLN
INTRAMUSCULAR | Status: AC
  Filled 2014-12-05: qty 2

## 2014-12-05 MED ORDER — HYDRALAZINE HCL 20 MG/ML IJ SOLN
INTRAMUSCULAR | Status: AC
  Filled 2014-12-05: qty 1

## 2014-12-05 MED ORDER — MIDAZOLAM HCL 10 MG/2ML IJ SOLN
INTRAMUSCULAR | Status: DC | PRN
  Administered 2014-12-05: 2 mg via INTRAVENOUS

## 2014-12-05 MED ORDER — FENTANYL CITRATE (PF) 100 MCG/2ML IJ SOLN
INTRAMUSCULAR | Status: AC
  Filled 2014-12-05: qty 2

## 2014-12-05 MED ORDER — HYDRALAZINE HCL 20 MG/ML IJ SOLN
INTRAMUSCULAR | Status: DC | PRN
  Administered 2014-12-05: 10 mg via INTRAVENOUS

## 2014-12-05 NOTE — Care Management Important Message (Signed)
Important Message  Patient Details  Name: Laurie [name] MRN: [id number] Date of Birth: [birth year]   Medicare Important Message Given:  Yes-second notification given    Delorse Lek 12/05/2014, 11:20 AM

## 2014-12-05 NOTE — Progress Notes (Signed)
  Echocardiogram Echocardiogram Transesophageal has been performed.  Laurie [name] 12/05/2014, 9:20 AM

## 2014-12-05 NOTE — CV Procedure (Signed)
TEE:  Inidication stroke 3mg  versed and 25 ug fentanyl  LVH EF 60% Negative bubble study Mild MR Mild AR Mild TR No LAA thrombus Mild calcific aortic debris No effusion No source of embolus  BP very high during procedure IV hydralazine given

## 2014-12-05 NOTE — Progress Notes (Signed)
PT Cancellation Note  Patient Details Name: Laurie [name] MRN: [id number] DOB: 1924/04/25   Cancelled Treatment:    Reason Eval/Treat Not Completed: Patient at procedure or test/unavailable   Duncan Dull 12/05/2014, 8:02 AM Alben Deeds, PT DPT  [phone]

## 2014-12-05 NOTE — Clinical Social Work Note (Cosign Needed)
Clinical Social Work Assessment  Patient Details  Name: Laurie [name] MRN: [id number] Date of Birth: 04-22-1924  Date of referral:  12/05/14               Reason for consult:  Facility Placement                Permission sought to share information with:  Family Supports Permission granted to share information::  Yes, Verbal Permission Granted  Name::      Calli Bashor, Lang Snow)  Agency::   (SNF)  Relationship::   (Son)  Contact Information:   Fritz Pickerel [phone])  Housing/Transportation Living arrangements for the past 2 months:  Single Family Home Source of Information:  Adult Children Patient Interpreter Needed:  None Criminal Activity/Legal Involvement Pertinent to Current Situation/Hospitalization:  No - Comment as needed Significant Relationships:  Adult Children Lives with:  Self Do you feel safe going back to the place where you live?  No Need for family participation in patient care:  Yes (Comment)  Care giving concerns:   Patient's son expressed concerns in reference to mother's insurance coverage while at [facility] facility.    Social Worker assessment / plan:   BSW intern spoke with patient's son, Fritz Pickerel via phone to confirm patient's placement at [facility] upon discharge. Patient's son expressed concerns in reference to mother's insurance coverage while at [facility] facility. BSW intern explained the SNF process to patient's son. Patient's son was very polite and understanding during assessment. Patient has strong family support. Patient's son informed BSW intern that he would contact brother to give updates on mother's discharge disposition.  Patient son informed BSW intern that patient does live alone however, will have 24 hour care once discharged from SNF facility. BSW intern will remain available if any further medical concerns arise.   Employment status:  Retired Nurse, adult PT Recommendations:  Inpatient [place] / Referral  to community resources:  [place]  Patient/Family's Response to care:   Patient's son was calm and polite during assessment. Patient's family is agreeable to PT recommendations.   Patient/Family's Understanding of and Emotional Response to Diagnosis, Current Treatment, and Prognosis:   Patient's son was very knowledgeable of mother's further medical workup.  Emotional Assessment Appearance:  Appears stated age Attitude/Demeanor/Rapport:  Unable to Assess Affect (typically observed):  Unable to Assess Orientation:  Oriented to Self, Oriented to Place, Oriented to  Time, Oriented to Situation Alcohol / Substance use:  Not Applicable Psych involvement (Current and /or in the community):  No (Comment)  Discharge Needs  Concerns to be addressed:  Financial / Insurance Concerns Readmission within the last 30 days:  No Current discharge risk:  None Barriers to Discharge:  No Barriers Identified   Leane Call, Student-SW 12/05/2014, 2:18 PM

## 2014-12-05 NOTE — Progress Notes (Signed)
[facility] PROGRESS NOTE  Laurie [name] PZW:[id number] DOB: 1924-03-22 DOA: 12/02/2014 PCP: Geoffery Lyons, MD  Subjective: Today the patient is doing well. Speech/mood appropriate and she is able to move all extremities. She denies fever, chills, N/V/D, or abdominal pain.   Assessment/Plan: Principal Problem:  CVA: MRI in ED indicate punctate acute embolic infarcts in parietal and left lobes. Carotid doppler showed 1-39% ICA plaquing. Echo showed EF of 42-35% with no embolic source. Tele/EKG do not indicate Afib. LE venous dopplers negative. TEE neg for thrombus/PFO. LDL 68 (see below) and HgbA1c 6.3. PT/SLP recommending CIR. Electrophysiology consulted- due to high likelihood of Afib,  recommended 30 day event monitor followed by implantable loop if negative. Continue ASA 325 and await additional recommendations from neurology.    Active Problems:  Delirium: Likely related to CVA/[place]. Patient remains at baseline, pleasant. Continue to monitor.   Dementia: Chronic, patient remains at baseline, pleasant. Continue supportive care and monitor.   Dyslipidemia: Chronic, managed at home with Lipitor 20 mg. LDL 68 (at goal), continue at-home statin.   Hypothyroidism: Chronic, managed at home with Synthroid. Continue at home therapy.  Essential HTN: Chronic, managed at home with Norvasc, metoprolol, and losartan. Current BP remains stable with metoprolol but increasing-will restart Amlodipine. Follow . Consider resumption of Losartan in next few days  Mild Rhabdomyolysis: CK 780 on admission, secondary to prolonged time on floor. IVF initiated on admission and CK improved.  IVF discontinued d/t CHF history. Continue to monitor.   Hypertensive Heart Disease: Chronic, mild focal septal hypertrophy in 2013 (not HOCM, but instead LVH from HTN). Echo in hospital revealed EF of 55-60% with moderate focal basal and hypertrophy of left ventricle and grade 2 diastolic dysfunction.  Continue to  monitor.   Chronic Diastolic CHF: Chronic, compensated. IVF discontinued yesterday. No evidence of fluid overload. Continue to monitor.   History of Bronchiectasis: Stable, no evidence of wheeze or crackles. PRN bronchodilators available. Continue to monitor.   Code Status: DNR Family Communication: Daughter at bedside  Disposition Plan: SNF vs. CIR (evaluation pending)    Consultants:  Neurology   Cardiology  Electrophysiology   Procedures:  TTE  TEE  Antibiotics:  None    Objective: Filed Vitals:   12/05/14 0935  BP: 129/55  Pulse: 87  Temp: 98.6 F (37 C)  Resp:    No intake or output data in the 24 hours ending 12/05/14 0937 Filed Weights   12/02/14 1304  Weight: 58.968 kg (130 lb)    Exam:   General: Well-appearing woman, laying in bed with no acute distress  Cardiovascular: RRR, no m/r/g. No peripheral edema.   Respiratory: CTA b/l, no wheeze or crackles  Abdomen: Soft, non-tender, non distended. + BS  Neuro: A&Ox3, no focal neurological deficits   Data Reviewed: Basic Metabolic Panel:  Recent Labs Lab 12/02/14 1330 12/03/14 0656 12/04/14 0553  NA 136 137 138  K 3.2* 4.4 3.6  CL 98* 101 103  CO2 28 25 26   GLUCOSE 134* 96 93  BUN 8 10 11   CREATININE 0.97 0.98 0.97  CALCIUM 8.4* 8.2* 8.2*  MG  --   --  2.3   Liver Function Tests:  Recent Labs Lab 12/02/14 1330  AST 28  ALT 13*  ALKPHOS 80  BILITOT 0.7  PROT 6.4*  ALBUMIN 2.2*   No results for input(s): LIPASE, AMYLASE in the last 168 hours. No results for input(s): AMMONIA in the last 168 hours. CBC:  Recent Labs Lab 12/02/14 1330  12/03/14 0656  WBC 9.9 10.2  NEUTROABS 8.5*  --   HGB 12.0 11.9*  HCT 38.2 37.0  MCV 86.6 86.4  PLT 246 208   Cardiac Enzymes:  Recent Labs Lab 12/02/14 1330 12/03/14 1209 12/04/14 0553  CKTOTAL 780* 692* 274*  CKMB 4.7  --   --    BNP (last 3 results) No results for input(s): BNP in the last 8760 hours.  ProBNP (last  3 results) No results for input(s): PROBNP in the last 8760 hours.  CBG: No results for input(s): GLUCAP in the last 168 hours.  No results found for this or any previous visit (from the past 240 hour(s)).   Studies: No results found.  Scheduled Meds: .  stroke: mapping our early stages of recovery book   Does not apply Once  . aspirin  300 mg Rectal Daily   Or  . aspirin  325 mg Oral Daily  . atorvastatin  20 mg Oral Daily  . enoxaparin (LOVENOX) injection  40 mg Subcutaneous Q24H  . levothyroxine  88 mcg Oral QAC breakfast  . metoprolol succinate  100 mg Oral Daily   Continuous Infusions: . sodium chloride 500 mL (12/05/14 0753)    Principal Problem:   CVA (cerebral infarction) Active Problems:   Hypothyroidism   Pulmonary hypertension (HCC)   Dyslipidemia   Hypokalemia   Dizziness   CVA (cerebral vascular accident) ([place])   Essential hypertension   Micayla Zeltman, Student-PA  [facility] If 7PM-7AM, please contact night-coverage at [email or web address], password [facility] 12/05/2014, 9:37 AM  LOS: 3 days    Attending MD note  Patient was seen, examined,treatment plan was discussed with the PA-S.  I have personally reviewed the clinical findings, lab, imaging studies and management of this patient in detail. I agree with the documentation, as recorded by the PA-S  Doing well, stroke workup including TEE complete. Cardiology is recommending 30 day event monitor prior to inserting a loop recorder. Continue current medications, restart amlodipine-await evaluation from CIR-if bed available okay to call to CIR today.   [facility]

## 2014-12-05 NOTE — Progress Notes (Signed)
STROKE TEAM PROGRESS NOTE   SUBJECTIVE (INTERVAL HISTORY) Patient's son, Laurie [name], [employer] wide director of security, is at the bedside. TEE has been completed and unremarkable. Plan for 30 day cardiac event monitoring and CIR placement.    OBJECTIVE Temp:  [97.7 F (36.5 C)-99.7 F (37.6 C)] 98.6 F (37 C) (10/11 0935) Pulse Rate:  [67-87] 87 (10/11 0935) Cardiac Rhythm:  [-] Normal sinus rhythm (10/11 0703) Resp:  [15-30] 26 (10/11 0900) BP: (129-225)/(52-107) 129/55 mmHg (10/11 0935) SpO2:  [92 %-98 %] 92 % (10/11 0935) FiO2 (%):  [28 %] 28 % (10/11 0753)  Recent Labs Lab 12/02/14 1330 12/03/14 0656 12/04/14 0553  NA 136 137 138  K 3.2* 4.4 3.6  CL 98* 101 103  CO2 28 25 26   GLUCOSE 134* 96 93  BUN 8 10 11   CREATININE 0.97 0.98 0.97  CALCIUM 8.4* 8.2* 8.2*  MG  --   --  2.3    Recent Labs Lab 12/02/14 1330 12/03/14 0656  WBC 9.9 10.2  NEUTROABS 8.5*  --   HGB 12.0 11.9*  HCT 38.2 37.0  MCV 86.6 86.4  PLT 246 208    Recent Labs Lab 12/02/14 1330 12/03/14 1209 12/04/14 0553  CKTOTAL 780* 692* 274*  CKMB 4.7  --   --     IMAGING I have personally reviewed the radiological images below and agree with the radiology interpretations.  Mri and Mra Head Wo Contrast   12/02/2014   1. Punctate, acute to subacute embolic infarcts in the parietal lobes and left frontal lobe. 2. Mild cerebral atrophy and chronic small vessel ischemic disease. 3. Moderately to severely motion degraded MRA. No evidence of large vessel occlusion.   Carotid Doppler  Bilateral: 1-39% ICA stenosis. Vertebral artery flow is antegrade.  2D Echocardiogram   - Left ventricle: The cavity size was normal. There was moderatefocal basal and mild concentric hypertrophy of the leftventricle. Systolic function was normal. The estimated ejectionfraction was in the range of 55% to 60%. Wall motion was normal;there were no regional wall motion abnormalities. Features areconsistent  with a pseudonormal left ventricular filling pattern,with concomitant abnormal relaxation and increased fillingpressure (grade 2 diastolic dysfunction). There was no evidenceof elevated ventricular filling pressure by Doppler parameters. - Aortic valve: Trileaflet; normal thickness leaflets. There was mild regurgitation. - Aortic root: The aortic root was mildly dilated measurin 41 mm. - Ascending aorta: The ascending aorta was normal in size. - Mitral valve: Structurally normal valve. There was mild regurgitation. - Left atrium: The atrium was mildly dilated. - Right ventricle: Systolic function was normal. - Right atrium: The atrium was normal in size. - Tricuspid valve: There was moderate regurgitation. - Pulmonic valve: There was mild regurgitation. - Pulmonary arteries: Systolic pressure was severely increased. PApeak pressure: 62 mm Hg (S). - Inferior vena cava: The vessel was normal in size. - Pericardium, extracardiac: There was no pericardial effusion.  Lower extremity venous duplex  There is no DVT or SVT noted in the bilateral lower extremities.   TEE  LVH EF 60%, Negative bubble study, Mild MR, Mild AR, Mild TR, No LAA thrombus, Mild calcific aortic debris, No effusion, No source of embolus.  BP very high during procedure IV hydralazine given   PHYSICAL EXAM General - Well nourished, well developed, no acute distress.  Ophthalmologic - not cooperative on fundi exam due to eye movement.  Cardiovascular - Regular rate and rhythm with no murmur.  Neck - supple, no carotid bruits  Mental Status -  Awake alert and orientation to place, and person were intact, but not to time. Language including expression, naming, repetition, comprehension was assessed and found intact. Fund of Knowledge was assessed and was impaired.  Cranial Nerves II - XII - II - Visual field intact OU. III, IV, VI - Extraocular movements intact. V - Facial sensation intact bilaterally. VII  - Facial movement intact bilaterally. VIII - Hearing & vestibular intact bilaterally. X - Palate elevates symmetrically. XI - Chin turning & shoulder shrug intact bilaterally. XII - Tongue protrusion intact.  Motor Strength - The patient's strength was 4/5 in all extremities, symmetric and pronator drift was absent.  Bulk was normal and fasciculations were absent.   Motor Tone - Muscle tone was assessed at the neck and appendages and was normal.  Reflexes - The patient's reflexes were symmetrical in all extremities and she had no pathological reflexes.  Sensory - Light touch, temperature/pinprick were assessed and were symmetrical.    Coordination - The patient had normal movements in the hands with no ataxia or dysmetria.  Tremor was absent.  Gait and Station - not tested due to sleepiness.   ASSESSMENT/PLAN Laurie [name] is a 81 y.o. female with history of CAD, HTN, HLD, MCI admitted for a fall with complain of dizziness and not able to get up. Symptoms much improved.    Stroke:  Bilateral 3 punctate subcortical infarct, likely embolic secondary to unknown source, suspicious for afib  MRI  B/L 3 punctate subcortical infarct at MCA territories  MRA  Motion degraded, no large vessel cut off  Carotid Doppler  unremarkable  2D Echo  EF 55-60%  LE venous dopplers negative  TEE no PFO. No SOE  Patient for outpt 30 day cardiac event monitoring.   LDL 68  HgbA1c 6.3  lovenox for VTE prophylaxis  Diet Heart Room service appropriate?: Yes; Fluid consistency:: Thin   no antithrombotic prior to admission, now on aspirin 325 mg orally every day  Patient counseled to be compliant with her antithrombotic medications  Ongoing aggressive stroke risk factor management  Therapy recommendations:  CIR, admissions coordinator following   Disposition:  Pending. Ok for discharge to CIR once loop placed from stroke standpoint Patient has a 10-15% risk of having another stroke over  the next year, the highest risk is within 2 weeks of the most recent stroke/TIA (risk of having a stroke following a stroke or TIA is the same). Ongoing risk factor control by Primary Care Physician Follow-up Stroke Clinic at [facility] with Dr. Rosalin Hawking in 2 months, order placed.  Hypertension  Home meds:   Norvasc, metoprolol and losartan BP goal gradually normotensive Currently on metoprolol  BP elevated during TEE. Received prn hydralazine  Patient counseled to be compliant with her blood pressure medications  Hyperlipidemia  Home meds:  none   LDL 68, goal < 70  On the goal  Other Stroke Risk Factors  Advanced age  Coronary artery disease  Other Active Problems  Mild anemia  Elevated CK  Other Pertinent History  Fall  [facility] day # 3  Neurology will sign off. Please call with questions. Pt will follow up with Dr. Erlinda Hong at [facility] in about 2 months. Thanks for the consult.  Rosalin Hawking, MD PhD Stroke Neurology 12/05/2014 5:22 PM   To contact Stroke Continuity provider, please refer to [email or web address] After hours, contact General Neurology

## 2014-12-05 NOTE — Care Management Note (Signed)
Case Management Note  Patient Details  Name: TEJAL MONROY MRN: [id number] Date of Birth: 01-May-1924  Subjective/Objective:                    Action/Plan: Patient admitted with CVA. Patient is from home alone. PT/OT recommending CIR. Plan is for patient to be discharged to SNF. CM will continue to follow for discharge needs.   Expected Discharge Date:  12/05/14               Expected Discharge Plan:  [place]  In-House Referral:     Discharge planning Services     Post Acute Care Choice:    Choice offered to:     DME Arranged:    DME Agency:     HH Arranged:    HH Agency:     Status of Service:  In process, will continue to follow  Medicare Important Message Given:  Yes-second notification given Date Medicare IM Given:    Medicare IM give by:    Date Additional Medicare IM Given:    Additional Medicare Important Message give by:     If discussed at [place] of Stay Meetings, dates discussed:    Additional Comments:  Pollie Friar, RN 12/05/2014, 3:46 PM

## 2014-12-05 NOTE — H&P (View-Only) (Signed)
ELECTROPHYSIOLOGY CONSULT NOTE  Patient ID: Laurie [name] MRN: [id number], DOB/AGE: [birth year]   Admit date: 12/02/2014 Date of Consult: 12/05/2014  Primary Physician: Geoffery Lyons, MD Primary Cardiologist: Aundra Dubin Reason for Consultation: Cryptogenic stroke; recommendations regarding Implantable Loop Recorder  History of Present Illness Laurie [name] was admitted on 12/02/2014 with dizziness.  They first developed symptoms while trying to walk to the bathroom the day of admission.  She was unable to get off the floor for several hours.  Imaging demonstrated bilateral punctate subcortical infarct.  She has undergone workup for stroke including echocardiogram and carotid dopplers.  The patient has been monitored on telemetry which has demonstrated sinus rhythm with no arrhythmias.  Inpatient stroke work-up is to be completed with a TEE.   Echocardiogram this admission demonstrated EF 55-60%, no RWMA, grade 2 diastolic dysfunction,  PA pressure 62, LA 32.     Prior to admission, the patient denies chest pain, shortness of breath, dizziness, palpitations, or syncope.     EP has been asked to evaluate for placement of an implantable loop recorder to monitor for atrial fibrillation.   Past Medical History  Diagnosis Date  . Coronary artery disease     NONOBSTRUCTIVE WITH LVH AND PROMINENT THEBESIAN VEINS, CURRENTLY WITHOUT CHEST PAIN  . Incontinence   . Back pain   . Asymmetric septal hypertrophy (HCC)   . Mitral regurgitation   . Pulmonary hypertension ([place])   . TIA (transient ischemic attack)   . Syncope and collapse   . History of vein stripping   . Esophagitis   . Benign neoplasm of colon 02/06/2005    Tubular adenomatous  . Diverticulosis of colon (without mention of hemorrhage)   . Hyperlipidemia   . Hypertension   . Arthritis   . GERD (gastroesophageal reflux disease)   . Hypothyroidism   . Macular degeneration, dry      Surgical History:  Past Surgical History    Procedure Laterality Date  . Cataract extraction, bilateral    . Lung biopsy    . Cardiac catheterization  09/2008  . Bil wrist surgery      d/t tendonitis  . Vein stripping in both legs    . Dilation and curettage of uterus    . Colonoscopy    . Video bronchoscopy with endobronchial navigation Bilateral 04/12/2012    Procedure: VIDEO BRONCHOSCOPY WITH ENDOBRONCHIAL NAVIGATION;  Surgeon: Collene Gobble, MD;  Location: [facility];  Service: Thoracic;  Laterality: Bilateral;     Prescriptions prior to admission  Medication Sig Dispense Refill Last Dose  . atorvastatin (LIPITOR) 20 MG tablet Take 20 mg by mouth at bedtime.   12/01/2014 at Unknown time  . budesonide-formoterol (SYMBICORT) 80-4.5 MCG/ACT inhaler Inhale 2 puffs into the lungs 2 (two) times daily. (Patient taking differently: Inhale 2 puffs into the lungs at bedtime as needed (shortness of breath/coughing). ) 1 Inhaler 5 12/01/2014 at Unknown time  . levothyroxine (SYNTHROID, LEVOTHROID) 88 MCG tablet Take 88 mcg by mouth at bedtime.   12/01/2014 at Unknown time  . losartan (COZAAR) 100 MG tablet Take 1 tablet (100 mg total) by mouth daily. (Patient taking differently: Take 100 mg by mouth at bedtime. ) 90 tablet 3 12/01/2014 at Unknown time  . metoprolol succinate (TOPROL-XL) 100 MG 24 hr tablet Take 1 tablet (100 mg total) by mouth daily. (Patient taking differently: Take 100 mg by mouth at bedtime. )   12/01/2014 at after supper - unknown time  . mirtazapine (REMERON)  15 MG tablet Take 15 mg by mouth at bedtime.   12/01/2014 at Unknown time  . amLODipine (NORVASC) 2.5 MG tablet Take 1 tablet (2.5 mg total) by mouth daily. (Patient not taking: Reported on 12/02/2014)   Not Taking at Unknown time  . traMADol (ULTRAM) 50 MG tablet One every 4 hours as needed for pain or cough (Patient not taking: Reported on 12/02/2014) 40 tablet 0 Not Taking at Unknown time    Inpatient Medications:  .  stroke: mapping our early stages of recovery book   Does  not apply Once  . aspirin  300 mg Rectal Daily   Or  . aspirin  325 mg Oral Daily  . atorvastatin  20 mg Oral Daily  . enoxaparin (LOVENOX) injection  40 mg Subcutaneous Q24H  . levothyroxine  88 mcg Oral QAC breakfast  . metoprolol succinate  100 mg Oral Daily    Allergies:  Allergies  Allergen Reactions  . Amoxicillin Swelling    Has patient had a PCN reaction causing immediate rash, facial/tongue/throat swelling, SOB or lightheadedness with hypotension: No Has patient had a PCN reaction causing severe rash involving mucus membranes or skin necrosis: No Has patient had a PCN reaction that required hospitalization No Has patient had a PCN reaction occurring within the last 10 years: No If all of the above answers are "NO", then may proceed with Cephalosporin use.  . Ativan [Lorazepam] Other (See Comments)    Causes hyperactiviity  . Benadryl [Diphenhydramine Hcl] Other (See Comments)    Causes hyperactivity  . Lodine [Etodolac] Hives    Social History   Social History  . Marital Status: Widowed    Spouse Name: N/A  . Number of Children: 4  . Years of Education: N/A   Occupational History  .     Social History Main Topics  . Smoking status: Never Smoker   . Smokeless tobacco: Never Used  . Alcohol Use: No  . Drug Use: No  . Sexual Activity: No   Other Topics Concern  . Not on file   Social History Narrative     Family History  Problem Relation Age of Onset  . Heart disease Mother   . Prostate cancer Father   . Hypertension Father       Review of Systems: All other systems reviewed and are otherwise negative except as noted above.  Physical Exam: Filed Vitals:   12/05/14 0212 12/05/14 0543 12/05/14 0732 12/05/14 0753  BP: 174/74 184/85 211/88   Pulse: 74 84 82   Temp: 99.7 F (37.6 C) 99.1 F (37.3 C) 98.3 F (36.8 C)   TempSrc: Oral Oral Oral   Resp: 16 16 28    Height:      Weight:      SpO2: 92% 93% 93% 93%    GEN- The patient is elderly  appearing, alert and oriented x 3 today.   Head- normocephalic, atraumatic Eyes-  Sclera clear, conjunctiva pink Ears- hearing intact Oropharynx- clear Neck- supple Lungs- Clear to ausculation bilaterally, normal work of breathing Heart- Regular rate and rhythm   GI- soft, NT, ND, + BS Extremities- no clubbing, cyanosis, or edema MS- age appropriate atrophy Skin- no rash or lesion Psych- euthymic mood, full affect   Labs:   Lab Results  Component Value Date   WBC 10.2 12/03/2014   HGB 11.9* 12/03/2014   HCT 37.0 12/03/2014   MCV 86.4 12/03/2014   PLT 208 12/03/2014    Recent Labs Lab 12/02/14 1330  12/04/14 0553  NA 136  < > 138  K 3.2*  < > 3.6  CL 98*  < > 103  CO2 28  < > 26  BUN 8  < > 11  CREATININE 0.97  < > 0.97  CALCIUM 8.4*  < > 8.2*  PROT 6.4*  --   --   BILITOT 0.7  --   --   ALKPHOS 80  --   --   ALT 13*  --   --   AST 28  --   --   GLUCOSE 134*  < > 93  < > = values in this interval not displayed.   Radiology/Studies: Mr Virgel Paling QI Contrast 19-Dec-2014   CLINICAL DATA:  Vertigo in generalized weakness.  EXAM: MRI HEAD WITHOUT CONTRAST  MRA HEAD WITHOUT CONTRAST  TECHNIQUE: Multiplanar, multiecho pulse sequences of the brain and surrounding structures were obtained without intravenous contrast. Angiographic images of the head were obtained using MRA technique without contrast.  COMPARISON:  Head CT 09/19/2013  FINDINGS: MRI HEAD FINDINGS  Some sequences are mildly to moderately motion degraded. There is a punctate focus of cortical restricted diffusion in the right parietal lobe. There is a punctate focus of abnormal diffusion weighted signal in the left parietal cortex without clear restricted diffusion, however evaluation is limited by its small size. A similar punctate focus of diffusion abnormality is present in the subcortical white matter of the left superior frontal gyrus. There is no acute or subacute large territory infarct.  There is likely a focus of  chronic microhemorrhage in the left parietal lobe. There is mild generalized cerebral atrophy. T2 hyperintensities in the subcortical and deep cerebral white matter are nonspecific but compatible with mild chronic small vessel ischemic disease. There may be a tiny chronic infarct in the left cerebellum. No mass, midline shift, or extra-axial fluid collection is seen.  Prior bilateral cataract extraction is noted. No significant inflammatory disease is seen in the paranasal sinuses or mastoid air cells. Major intracranial vascular flow voids are grossly preserved.  MRA HEAD FINDINGS  The study is moderately to severely motion degraded. The visualized distal vertebral arteries appear patent with the right being dominant. Left vertebral artery is particularly hypoplastic distal to the PICA origin. Basilar artery is patent without gross high-grade stenosis, although evaluation is limited by motion. Flow is present in the PCAs with limited evaluation for stenosis due to motion.  Internal carotid arteries are patent from skullbase to carotid termini without evidence of flow limiting stenosis. A1 and M1 segments are patent. Evaluation for stenosis and evaluation of M2 and A2 segments cannot be adequately performed due to motion artifact.  IMPRESSION: 1. Punctate, acute to subacute embolic infarcts in the parietal lobes and left frontal lobe. 2. Mild cerebral atrophy and chronic small vessel ischemic disease. 3. Moderately to severely motion degraded MRA. No evidence of large vessel occlusion.   Electronically Signed   By: Logan Bores M.D.   On: 2014/12/19 18:09    12-lead ECG sinus rhythm All prior EKG's in EPIC reviewed with no documented atrial fibrillation  Telemetry sinus rhythm with runs of atrial tachycardia, PVC's  Assessment and Plan:  1. Cryptogenic stroke The patient presents with cryptogenic stroke.  The patient has a TEE planned for this AM. With atrial ectopy on telemetry, high PA pressure, I think  the likelihood of identifying atrial fibrillation is high. Would recommend 30 day event monitor followed by implantable loop recorder if negative.  Discussed  with patient today and family.   We will arrange event monitor and follow up in our office. Please call with questions.   Laurie Berthold, Laurie [name] 12/05/2014 8:08 AM

## 2014-12-05 NOTE — Consult Note (Signed)
ELECTROPHYSIOLOGY CONSULT NOTE  Patient ID: [name] MRN: [id number], DOB/AGE: [birth year]   Admit date: 12/02/2014 Date of Consult: 12/05/2014  Primary Physician: Geoffery Lyons, MD Primary Cardiologist: Aundra Dubin Reason for Consultation: Cryptogenic stroke; recommendations regarding Implantable Loop Recorder  History of Present Illness Laurie [name] was admitted on 12/02/2014 with dizziness.  She was acutely weak the day of admission and was unable to walk and was on the floor for several hours prior to being transported to the hospital.   Imaging demonstrated bilateral punctate subcortical infarcts.  She has undergone workup for stroke including echocardiogram and carotid dopplers.  The patient has been monitored on telemetry which has demonstrated sinus rhythm with short runs of atrial tachycardia.  Inpatient stroke work-up is to be completed with a TEE.   Echocardiogram this admission demonstrated EF 27-51%, grade 2 diastolic dysfunction, LA 32, PA pressure 62.     Prior to admission, the patient denies chest pain, shortness of breath, dizziness, palpitations, or syncope.    EP has been asked to evaluate for placement of an implantable loop recorder to monitor for atrial fibrillation.   Past Medical History  Diagnosis Date  . Coronary artery disease     NONOBSTRUCTIVE WITH LVH AND PROMINENT THEBESIAN VEINS, CURRENTLY WITHOUT CHEST PAIN  . Incontinence   . Back pain   . Asymmetric septal hypertrophy (HCC)   . Mitral regurgitation   . Pulmonary hypertension ([place])   . TIA (transient ischemic attack)   . Syncope and collapse   . History of vein stripping   . Esophagitis   . Benign neoplasm of colon 02/06/2005    Tubular adenomatous  . Diverticulosis of colon (without mention of hemorrhage)   . Hyperlipidemia   . Hypertension   . Arthritis   . GERD (gastroesophageal reflux disease)   . Hypothyroidism   . Macular degeneration, dry      Surgical History:  Past  Surgical History  Procedure Laterality Date  . Cataract extraction, bilateral    . Lung biopsy    . Cardiac catheterization  09/2008  . Bil wrist surgery      d/t tendonitis  . Vein stripping in both legs    . Dilation and curettage of uterus    . Colonoscopy    . Video bronchoscopy with endobronchial navigation Bilateral 04/12/2012    Procedure: VIDEO BRONCHOSCOPY WITH ENDOBRONCHIAL NAVIGATION;  Surgeon: Collene Gobble, MD;  Location: [facility];  Service: Thoracic;  Laterality: Bilateral;     Prescriptions prior to admission  Medication Sig Dispense Refill Last Dose  . atorvastatin (LIPITOR) 20 MG tablet Take 20 mg by mouth at bedtime.   12/01/2014 at Unknown time  . budesonide-formoterol (SYMBICORT) 80-4.5 MCG/ACT inhaler Inhale 2 puffs into the lungs 2 (two) times daily. (Patient taking differently: Inhale 2 puffs into the lungs at bedtime as needed (shortness of breath/coughing). ) 1 Inhaler 5 12/01/2014 at Unknown time  . levothyroxine (SYNTHROID, LEVOTHROID) 88 MCG tablet Take 88 mcg by mouth at bedtime.   12/01/2014 at Unknown time  . losartan (COZAAR) 100 MG tablet Take 1 tablet (100 mg total) by mouth daily. (Patient taking differently: Take 100 mg by mouth at bedtime. ) 90 tablet 3 12/01/2014 at Unknown time  . metoprolol succinate (TOPROL-XL) 100 MG 24 hr tablet Take 1 tablet (100 mg total) by mouth daily. (Patient taking differently: Take 100 mg by mouth at bedtime. )   12/01/2014 at after supper - unknown time  . mirtazapine (REMERON)  15 MG tablet Take 15 mg by mouth at bedtime.   12/01/2014 at Unknown time  . amLODipine (NORVASC) 2.5 MG tablet Take 1 tablet (2.5 mg total) by mouth daily. (Patient not taking: Reported on 12/02/2014)   Not Taking at Unknown time  . traMADol (ULTRAM) 50 MG tablet One every 4 hours as needed for pain or cough (Patient not taking: Reported on 12/02/2014) 40 tablet 0 Not Taking at Unknown time    Inpatient Medications:  .  stroke: mapping our early stages of  recovery book   Does not apply Once  . amLODipine  5 mg Oral Daily  . aspirin  300 mg Rectal Daily   Or  . aspirin  325 mg Oral Daily  . atorvastatin  20 mg Oral Daily  . enoxaparin (LOVENOX) injection  40 mg Subcutaneous Q24H  . levothyroxine  88 mcg Oral QAC breakfast  . metoprolol succinate  100 mg Oral Daily    Allergies:  Allergies  Allergen Reactions  . Amoxicillin Swelling    Has patient had a PCN reaction causing immediate rash, facial/tongue/throat swelling, SOB or lightheadedness with hypotension: No Has patient had a PCN reaction causing severe rash involving mucus membranes or skin necrosis: No Has patient had a PCN reaction that required hospitalization No Has patient had a PCN reaction occurring within the last 10 years: No If all of the above answers are "NO", then may proceed with Cephalosporin use.  . Ativan [Lorazepam] Other (See Comments)    Causes hyperactiviity  . Benadryl [Diphenhydramine Hcl] Other (See Comments)    Causes hyperactivity  . Lodine [Etodolac] Hives    Social History   Social History  . Marital Status: Widowed    Spouse Name: N/A  . Number of Children: 4  . Years of Education: N/A   Occupational History  .     Social History Main Topics  . Smoking status: Never Smoker   . Smokeless tobacco: Never Used  . Alcohol Use: No  . Drug Use: No  . Sexual Activity: No   Other Topics Concern  . Not on file   Social History Narrative     Family History  Problem Relation Age of Onset  . Heart disease Mother   . Prostate cancer Father   . Hypertension Father       Review of Systems: All other systems reviewed and are otherwise negative except as noted above.  Physical Exam: Filed Vitals:   12/05/14 0850 12/05/14 0900 12/05/14 0935 12/05/14 1036  BP: 133/53 130/52 129/55 106/51  Pulse: 82 85 87 86  Temp:   98.6 F (37 C) 98.4 F (36.9 C)  TempSrc:   Oral Oral  Resp: 25 26  24   Height:      Weight:      SpO2: 95% 94% 92%  93%    GEN- The patient is elderly appearing, alert and oriented x 3 today.   Head- normocephalic, atraumatic Eyes-  Sclera clear, conjunctiva pink Ears- hearing intact Oropharynx- clear Neck- supple Lungs- Clear to ausculation bilaterally, normal work of breathing Heart- Regular rate and rhythm, no murmurs, rubs or gallops  GI- soft, NT, ND, + BS Extremities- no clubbing, cyanosis, or edema MS- age appropriate atrophy Skin- no rash or lesion Psych- euthymic mood, full affect   Labs:   Lab Results  Component Value Date   WBC 10.2 12/03/2014   HGB 11.9* 12/03/2014   HCT 37.0 12/03/2014   MCV 86.4 12/03/2014   PLT 208  12/03/2014    Recent Labs Lab 2014/12/28 1330  12/04/14 0553  NA 136  < > 138  K 3.2*  < > 3.6  CL 98*  < > 103  CO2 28  < > 26  BUN 8  < > 11  CREATININE 0.97  < > 0.97  CALCIUM 8.4*  < > 8.2*  PROT 6.4*  --   --   BILITOT 0.7  --   --   ALKPHOS 80  --   --   ALT 13*  --   --   AST 28  --   --   GLUCOSE 134*  < > 93  < > = values in this interval not displayed.   Radiology/Studies: Mr Virgel Paling EZ Contrast 12-28-2014   CLINICAL DATA:  Vertigo in generalized weakness.  EXAM: MRI HEAD WITHOUT CONTRAST  MRA HEAD WITHOUT CONTRAST  TECHNIQUE: Multiplanar, multiecho pulse sequences of the brain and surrounding structures were obtained without intravenous contrast. Angiographic images of the head were obtained using MRA technique without contrast.  COMPARISON:  Head CT 09/19/2013  FINDINGS: MRI HEAD FINDINGS  Some sequences are mildly to moderately motion degraded. There is a punctate focus of cortical restricted diffusion in the right parietal lobe. There is a punctate focus of abnormal diffusion weighted signal in the left parietal cortex without clear restricted diffusion, however evaluation is limited by its small size. A similar punctate focus of diffusion abnormality is present in the subcortical white matter of the left superior frontal gyrus. There is no acute  or subacute large territory infarct.  There is likely a focus of chronic microhemorrhage in the left parietal lobe. There is mild generalized cerebral atrophy. T2 hyperintensities in the subcortical and deep cerebral white matter are nonspecific but compatible with mild chronic small vessel ischemic disease. There may be a tiny chronic infarct in the left cerebellum. No mass, midline shift, or extra-axial fluid collection is seen.  Prior bilateral cataract extraction is noted. No significant inflammatory disease is seen in the paranasal sinuses or mastoid air cells. Major intracranial vascular flow voids are grossly preserved.  MRA HEAD FINDINGS  The study is moderately to severely motion degraded. The visualized distal vertebral arteries appear patent with the right being dominant. Left vertebral artery is particularly hypoplastic distal to the PICA origin. Basilar artery is patent without gross high-grade stenosis, although evaluation is limited by motion. Flow is present in the PCAs with limited evaluation for stenosis due to motion.  Internal carotid arteries are patent from skullbase to carotid termini without evidence of flow limiting stenosis. A1 and M1 segments are patent. Evaluation for stenosis and evaluation of M2 and A2 segments cannot be adequately performed due to motion artifact.  IMPRESSION: 1. Punctate, acute to subacute embolic infarcts in the parietal lobes and left frontal lobe. 2. Mild cerebral atrophy and chronic small vessel ischemic disease. 3. Moderately to severely motion degraded MRA. No evidence of large vessel occlusion.   Electronically Signed   By: Logan Bores M.D.   On: 12/28/2014 18:09   12-lead ECG sinus rhythm All prior EKG's in EPIC reviewed with no documented atrial fibrillation  Telemetry sinus rhythm with short runs of atrial tach, PVC's  Assessment and Plan:  1. Cryptogenic stroke The patient presents with cryptogenic stroke.  The patient has a TEE planned for this  AM.  With atrial tachycardia and increased PA pressure, the likelihood of developing atrial fibrillation is high. Would recommend 30 day event monitor followed  by ILR if event monitor negative.  Discussed with patient and her daughter today. Will arrange event monitor and follow up in clinic.   Please call with questions.   Patsey Berthold, NP 12/05/2014 11:18 AM   EP Attending  Patient seen and examined. Discussed situation with the patient and her daughter. I concur with the findings as noted by Chanetta Marshall, NP-C. The patient is elderly but stable and exam reveals a  RRR. Lungs are clear. Legs have no edema. She has not had atrial fib in the hospital but has bursts of atrial tachycardia on cardiac monitoring, and along with elevated PA pressures makes the likely diagnosis of atrial fib as the cause of her stroke very high. She is likely to have atrial fib in the next month. Would suggest we have her wear a monitor for 4 weeks. If no atrial fib (unlikely) we will place an ILR as an outpatient.   Mikle Bosworth.D.

## 2014-12-05 NOTE — Progress Notes (Addendum)
Rehab admissions - Evaluated for possible admission.  I met with patient and her sister.  I spoke with patient's son and dtr in law, Fritz Pickerel and his wife, at home.  I will also talk with Lang Snow who works here at [employer] in [employer].  The plan is likely to be SNF at [facility] or [facility] as family all work.  I will update all soon.  Call me for questions.  #[phone]  I met with son, Timmothy Sours.  He is in agreement to SNF placement and prefers [place] area.  [place], [place], and the village at [place] were mentioned to me.  I will notify social worker of plan for SNF placement.  Call me for questions.  #[phone]

## 2014-12-05 NOTE — Consult Note (Signed)
ELECTROPHYSIOLOGY CONSULT NOTE  Patient ID: Laurie [name] MRN: [id number], DOB/AGE: 79-Oct-1926   Admit date: 12/02/2014 Date of Consult: 12/05/2014  Primary Physician: Geoffery Lyons, MD Primary Cardiologist: Aundra Dubin Reason for Consultation: Cryptogenic stroke; recommendations regarding Implantable Loop Recorder  History of Present Illness Laurie [name] was admitted on 12/02/2014 with dizziness.  They first developed symptoms while trying to walk to the bathroom the day of admission.  She was unable to get off the floor for several hours.  Imaging demonstrated bilateral punctate subcortical infarct.  She has undergone workup for stroke including echocardiogram and carotid dopplers.  The patient has been monitored on telemetry which has demonstrated sinus rhythm with no arrhythmias.  Inpatient stroke work-up is to be completed with a TEE.   Echocardiogram this admission demonstrated EF 55-60%, no RWMA, grade 2 diastolic dysfunction,  PA pressure 62, LA 32.     Prior to admission, the patient denies chest pain, shortness of breath, dizziness, palpitations, or syncope.     EP has been asked to evaluate for placement of an implantable loop recorder to monitor for atrial fibrillation.   Past Medical History  Diagnosis Date  . Coronary artery disease     NONOBSTRUCTIVE WITH LVH AND PROMINENT THEBESIAN VEINS, CURRENTLY WITHOUT CHEST PAIN  . Incontinence   . Back pain   . Asymmetric septal hypertrophy (HCC)   . Mitral regurgitation   . Pulmonary hypertension ([place])   . TIA (transient ischemic attack)   . Syncope and collapse   . History of vein stripping   . Esophagitis   . Benign neoplasm of colon 02/06/2005    Tubular adenomatous  . Diverticulosis of colon (without mention of hemorrhage)   . Hyperlipidemia   . Hypertension   . Arthritis   . GERD (gastroesophageal reflux disease)   . Hypothyroidism   . Macular degeneration, dry      Surgical History:  Past Surgical History    Procedure Laterality Date  . Cataract extraction, bilateral    . Lung biopsy    . Cardiac catheterization  09/2008  . Bil wrist surgery      d/t tendonitis  . Vein stripping in both legs    . Dilation and curettage of uterus    . Colonoscopy    . Video bronchoscopy with endobronchial navigation Bilateral 04/12/2012    Procedure: VIDEO BRONCHOSCOPY WITH ENDOBRONCHIAL NAVIGATION;  Surgeon: Collene Gobble, MD;  Location: [facility];  Service: Thoracic;  Laterality: Bilateral;     Prescriptions prior to admission  Medication Sig Dispense Refill Last Dose  . atorvastatin (LIPITOR) 20 MG tablet Take 20 mg by mouth at bedtime.   12/01/2014 at Unknown time  . budesonide-formoterol (SYMBICORT) 80-4.5 MCG/ACT inhaler Inhale 2 puffs into the lungs 2 (two) times daily. (Patient taking differently: Inhale 2 puffs into the lungs at bedtime as needed (shortness of breath/coughing). ) 1 Inhaler 5 12/01/2014 at Unknown time  . levothyroxine (SYNTHROID, LEVOTHROID) 88 MCG tablet Take 88 mcg by mouth at bedtime.   12/01/2014 at Unknown time  . losartan (COZAAR) 100 MG tablet Take 1 tablet (100 mg total) by mouth daily. (Patient taking differently: Take 100 mg by mouth at bedtime. ) 90 tablet 3 12/01/2014 at Unknown time  . metoprolol succinate (TOPROL-XL) 100 MG 24 hr tablet Take 1 tablet (100 mg total) by mouth daily. (Patient taking differently: Take 100 mg by mouth at bedtime. )   12/01/2014 at after supper - unknown time  . mirtazapine (REMERON)  15 MG tablet Take 15 mg by mouth at bedtime.   12/01/2014 at Unknown time  . amLODipine (NORVASC) 2.5 MG tablet Take 1 tablet (2.5 mg total) by mouth daily. (Patient not taking: Reported on 12/02/2014)   Not Taking at Unknown time  . traMADol (ULTRAM) 50 MG tablet One every 4 hours as needed for pain or cough (Patient not taking: Reported on 12/02/2014) 40 tablet 0 Not Taking at Unknown time    Inpatient Medications:  .  stroke: mapping our early stages of recovery book   Does  not apply Once  . aspirin  300 mg Rectal Daily   Or  . aspirin  325 mg Oral Daily  . atorvastatin  20 mg Oral Daily  . enoxaparin (LOVENOX) injection  40 mg Subcutaneous Q24H  . levothyroxine  88 mcg Oral QAC breakfast  . metoprolol succinate  100 mg Oral Daily    Allergies:  Allergies  Allergen Reactions  . Amoxicillin Swelling    Has patient had a PCN reaction causing immediate rash, facial/tongue/throat swelling, SOB or lightheadedness with hypotension: No Has patient had a PCN reaction causing severe rash involving mucus membranes or skin necrosis: No Has patient had a PCN reaction that required hospitalization No Has patient had a PCN reaction occurring within the last 10 years: No If all of the above answers are "NO", then may proceed with Cephalosporin use.  . Ativan [Lorazepam] Other (See Comments)    Causes hyperactiviity  . Benadryl [Diphenhydramine Hcl] Other (See Comments)    Causes hyperactivity  . Lodine [Etodolac] Hives    Social History   Social History  . Marital Status: Widowed    Spouse Name: N/A  . Number of Children: 4  . Years of Education: N/A   Occupational History  .     Social History Main Topics  . Smoking status: Never Smoker   . Smokeless tobacco: Never Used  . Alcohol Use: No  . Drug Use: No  . Sexual Activity: No   Other Topics Concern  . Not on file   Social History Narrative     Family History  Problem Relation Age of Onset  . Heart disease Mother   . Prostate cancer Father   . Hypertension Father       Review of Systems: All other systems reviewed and are otherwise negative except as noted above.  Physical Exam: Filed Vitals:   12/05/14 0212 12/05/14 0543 12/05/14 0732 12/05/14 0753  BP: 174/74 184/85 211/88   Pulse: 74 84 82   Temp: 99.7 F (37.6 C) 99.1 F (37.3 C) 98.3 F (36.8 C)   TempSrc: Oral Oral Oral   Resp: 16 16 28    Height:      Weight:      SpO2: 92% 93% 93% 93%    GEN- The patient is elderly  appearing, alert and oriented x 3 today.   Head- normocephalic, atraumatic Eyes-  Sclera clear, conjunctiva pink Ears- hearing intact Oropharynx- clear Neck- supple Lungs- Clear to ausculation bilaterally, normal work of breathing Heart- Regular rate and rhythm   GI- soft, NT, ND, + BS Extremities- no clubbing, cyanosis, or edema MS- age appropriate atrophy Skin- no rash or lesion Psych- euthymic mood, full affect   Labs:   Lab Results  Component Value Date   WBC 10.2 12/03/2014   HGB 11.9* 12/03/2014   HCT 37.0 12/03/2014   MCV 86.4 12/03/2014   PLT 208 12/03/2014    Recent Labs Lab 12/02/14 1330  12/04/14 0553  NA 136  < > 138  K 3.2*  < > 3.6  CL 98*  < > 103  CO2 28  < > 26  BUN 8  < > 11  CREATININE 0.97  < > 0.97  CALCIUM 8.4*  < > 8.2*  PROT 6.4*  --   --   BILITOT 0.7  --   --   ALKPHOS 80  --   --   ALT 13*  --   --   AST 28  --   --   GLUCOSE 134*  < > 93  < > = values in this interval not displayed.   Radiology/Studies: Mr Virgel Paling VP Contrast 12-11-2014   CLINICAL DATA:  Vertigo in generalized weakness.  EXAM: MRI HEAD WITHOUT CONTRAST  MRA HEAD WITHOUT CONTRAST  TECHNIQUE: Multiplanar, multiecho pulse sequences of the brain and surrounding structures were obtained without intravenous contrast. Angiographic images of the head were obtained using MRA technique without contrast.  COMPARISON:  Head CT 09/19/2013  FINDINGS: MRI HEAD FINDINGS  Some sequences are mildly to moderately motion degraded. There is a punctate focus of cortical restricted diffusion in the right parietal lobe. There is a punctate focus of abnormal diffusion weighted signal in the left parietal cortex without clear restricted diffusion, however evaluation is limited by its small size. A similar punctate focus of diffusion abnormality is present in the subcortical white matter of the left superior frontal gyrus. There is no acute or subacute large territory infarct.  There is likely a focus of  chronic microhemorrhage in the left parietal lobe. There is mild generalized cerebral atrophy. T2 hyperintensities in the subcortical and deep cerebral white matter are nonspecific but compatible with mild chronic small vessel ischemic disease. There may be a tiny chronic infarct in the left cerebellum. No mass, midline shift, or extra-axial fluid collection is seen.  Prior bilateral cataract extraction is noted. No significant inflammatory disease is seen in the paranasal sinuses or mastoid air cells. Major intracranial vascular flow voids are grossly preserved.  MRA HEAD FINDINGS  The study is moderately to severely motion degraded. The visualized distal vertebral arteries appear patent with the right being dominant. Left vertebral artery is particularly hypoplastic distal to the PICA origin. Basilar artery is patent without gross high-grade stenosis, although evaluation is limited by motion. Flow is present in the PCAs with limited evaluation for stenosis due to motion.  Internal carotid arteries are patent from skullbase to carotid termini without evidence of flow limiting stenosis. A1 and M1 segments are patent. Evaluation for stenosis and evaluation of M2 and A2 segments cannot be adequately performed due to motion artifact.  IMPRESSION: 1. Punctate, acute to subacute embolic infarcts in the parietal lobes and left frontal lobe. 2. Mild cerebral atrophy and chronic small vessel ischemic disease. 3. Moderately to severely motion degraded MRA. No evidence of large vessel occlusion.   Electronically Signed   By: Logan Bores M.D.   On: 11-Dec-2014 18:09    12-lead ECG sinus rhythm All prior EKG's in EPIC reviewed with no documented atrial fibrillation  Telemetry sinus rhythm with runs of atrial tachycardia, PVC's  Assessment and Plan:  1. Cryptogenic stroke The patient presents with cryptogenic stroke.  The patient has a TEE planned for this AM. With atrial ectopy on telemetry, high PA pressure, I think  the likelihood of identifying atrial fibrillation is high. Would recommend 30 day event monitor followed by implantable loop recorder if negative.  Discussed  with patient today and family.   We will arrange event monitor and follow up in our office. Please call with questions.   Patsey Berthold, NP 12/05/2014 8:08 AM

## 2014-12-05 NOTE — Interval H&P Note (Signed)
History and Physical Interval Note:  12/05/2014 8:12 AM  Laurie [name]  has presented today for surgery, with the diagnosis of STROKE  The various methods of treatment have been discussed with the patient and family. After consideration of risks, benefits and other options for treatment, the patient has consented to  Procedure(s): TRANSESOPHAGEAL ECHOCARDIOGRAM (TEE) (N/A) as a surgical intervention .  The patient's history has been reviewed, patient examined, no change in status, stable for surgery.  I have reviewed the patient's chart and labs.  Questions were answered to the patient's satisfaction.     Jenkins Rouge

## 2014-12-05 NOTE — Clinical Social Work Placement (Cosign Needed)
   CLINICAL SOCIAL WORK PLACEMENT  NOTE  Date:  12/05/2014  Patient Details  Name: Laurie [name] MRN: [id number] Date of Birth: 1925/02/06  Clinical Social Work is seeking post-discharge placement for this patient at the [place] level of care (*CSW will initial, date and re-position this form in  chart as items are completed):  Yes   Patient/family provided with [place] Work Department's list of facilities offering this level of care within the geographic area requested by the patient (or if unable, by the patient's family).  Yes   Patient/family informed of their freedom to choose among providers that offer the needed level of care, that participate in Medicare, Medicaid or managed care program needed by the patient, have an available bed and are willing to accept the patient.  Yes   Patient/family informed of [place]'s ownership interest in [facility] and [facility], as well as of the fact that they are under no obligation to receive care at these facilities.  PASRR submitted to EDS on       PASRR number received on       Existing PASRR number confirmed on       FL2 transmitted to all facilities in geographic area requested by pt/family on 12/05/14     FL2 transmitted to all facilities within larger geographic area on       Patient informed that his/her managed care company has contracts with or will negotiate with certain facilities, including the following:            Patient/family informed of bed offers received.  Patient chooses bed at       Physician recommends and patient chooses bed at      Patient to be transferred to   on  .  Patient to be transferred to facility by       Patient family notified on   of transfer.  Name of family member notified:        PHYSICIAN Please sign DNR, Please sign FL2     Additional Comment:    _______________________________________________ Leane Call,  Student-SW 12/05/2014, 2:18 PM

## 2014-12-05 NOTE — Progress Notes (Signed)
MEDICATION RELATED NOTE    Pharmacy Re:  Levothyroxine  Assessment: 81yo with PMH of hypothyroidism on Levothyroxin 34mcg daily prior to admit.  TSH on this admission was 0.575 and her dose was increased to 88 mcg daily.    The American Association of Clinical Endocrinologists (AACE) and other professional groups recommended a narrower range of .3 to 3.0. This means that hyperthyroidism is suspected at TSH levels below .3, and 3.0 and above may be considered diagnostic of hypothyroidism.  Plan:   - Consider re-checking TSH in a month or so to ensure that her increased dose has not put her below 0.3 and thus symptoms of hyperthyroidism.  Rober Minion, PharmD., MS Clinical Pharmacist Pager:  [phone] Thank you for allowing pharmacy to be part of this patients care team. 12/05/2014,3:37 PM

## 2014-12-06 ENCOUNTER — Encounter (HOSPITAL_COMMUNITY): Payer: Self-pay | Admitting: Cardiovascular Disease

## 2014-12-06 ENCOUNTER — Encounter
Admission: RE | Admit: 2014-12-06 | Discharge: 2014-12-06 | Disposition: A | Discharge status: Home / Self Care | Payer: Commercial Managed Care - HMO | Source: Ambulatory Visit | Attending: Internal Medicine | Admitting: Internal Medicine

## 2014-12-06 MED ORDER — ASPIRIN 325 MG PO TABS
325.0000 mg | ORAL_TABLET | Freq: Every day | ORAL | Status: AC
Start: 2014-12-06 — End: ?

## 2014-12-06 MED ORDER — TRAMADOL HCL 50 MG PO TABS: ORAL_TABLET | ORAL | Status: AC

## 2014-12-06 NOTE — Progress Notes (Signed)
Speech Language Pathology Treatment: Cognitive-Linquistic  Patient Details Name: Laurie [name] MRN: [id number] DOB: Dec 25, 1924 Today's Date: 12/06/2014 Time: 1340-1403 SLP Time Calculation (min) (ACUTE ONLY): 23 min  Assessment / Plan / Recommendation Clinical Impression  Pt was seen for skilled ST targeting cognitive goals.  Upon arrival, pt was seated upright in bed, awake, alert, pleasantly confused, and agreeable to participate in [place].  Pt was oriented to place with min verbal cues, to place with max verbal cues.  Pt was able to identify 1 change occurring s/p CVA with min question cues and she was able to recognize how her current deficits will impact her functional independence for familiar self care and/or home management tasks with mod-max assist verbal cues.  Pt was noted to be tangential during functional conversations with SLP and required min verbal cues for topic maintenance and redirection to task.  Pt's family was present and SLP provided brief instruction regarding use of external aids to maximize pt's recall of daily information and facilitate more consistent orientation.  Pt is transferring to SNF this afternoon.     HPI Other Pertinent Information: Laurie [name] is a 81 y.o. female with a past medical history of hypertension, dyslipidemia, coronary artery disease, admitted after being found on the ground by daughter. MRI revealed Punctate, acute to subacute embolic infarcts in the parietal lobes and left frontal lobe.   Pertinent Vitals Pain Assessment: No/denies pain Faces Pain Scale: Hurts little more Pain Location: lower back with certain positinos Pain Descriptors / Indicators: Aching Pain Intervention(s): Limited activity within patient's tolerance;Monitored during session;Repositioned  SLP Plan  Continue with current plan of care    Recommendations              Plan: Continue with current plan of care    GO     Shariah Assad, Selinda Orion 12/06/2014, 2:07 PM

## 2014-12-06 NOTE — Discharge Summary (Signed)
Physician Discharge Summary  Laurie [name] AST:[id number] DOB: 05-13-1924 DOA: 12/02/2014  PCP: Geoffery Lyons, MD  Admit date: 12/02/2014 Discharge date: 12/06/2014  Time spent: 35 minutes  Recommendations for Outpatient Follow-up:  Patient will be discharged to SNF [facility])  Please repeat lipid panel in 8 weeks, please repeat A1c in 3 months  Please continue to monitor BP (restarted all HTN medications on discharge)  Follow-up with cardiology in for placement of 30-day event monitor   Discharge Diagnoses:  Principal Problem:   CVA (cerebral infarction) Active Problems:   Hypothyroidism   Pulmonary hypertension ([place])   Dyslipidemia   Hypokalemia   Dizziness   CVA (cerebral vascular accident) [facility])   Essential hypertension   Discharge Condition: Stable  Diet recommendation: Heart Healthy, Age appropriate solids (thin)   Filed Weights   12/02/14 1304  Weight: 58.968 kg (130 lb)    History of present illness:  Laurie [name] is a 81 y.o. female with a past medical history of hypertension, dyslipidemia, coronary artery disease, brought to the ED on 12/02/14 by EMS after she was found down by her daughter. She was confused and disoriented in the ED, but family members report that she has had a gradual functional decline over the past 6 months becoming more dependent. Her daughter last saw her at her baseline on 12/01/14 around 8:00 PM and found her on the floor outside of her bathroom on 12/02/14 AM. Laurie [name] was able to state that she felt too dizzy to stand up. There were no reports of chest pain, nausea, vomiting, palpitations, shortness of breath prior to event. Workup in the emergency department included MRI of the brain that revealed acute CVA per radiology. She was admitted for further workup of CVA.   Hospital Course:  Principal Problem:  CVA: MRI in ED indicated punctate acute embolic infarcts in parietal and left lobes. Carotid doppler showed  1-39% ICA plaquing. Echo showed EF of 98-92% with no embolic source. Tele/EKG did not indicate Afib during hospital course. LE venous dopplers negative. TEE neg for thrombus/PFO. LDL 68 (see below) and HgbA1c 6.3. PT/SLP recommending  SNF. Electrophysiology consulted- due to high likelihood of Afib, recommended 30 day event monitor followed by implantable loop if negative. Continue ASA 325 on discharge and FU with cardiology for placement of event monitor.   Active Problems:  Delirium: Likely related to CVA/[place]. Patient remained at baseline throughout hospitalization.   Dementia: Chronic, patient remained at baseline throughout hospitalization.   Dyslipidemia: Chronic, managed at home with Lipitor 20 mg. LDL 68 (at goal, post CVA should be < 70), continue at-home statin on discharge and monitor lipids regularly .   Hypothyroidism: Chronic, managed at home with Synthroid. No exacerbations during hospitalization. Continue Synthroid on discharge.   Essential HTN: Chronic, managed at home with Norvasc, metoprolol, and losartan. Medications held on admission for permissive HTN. Slowly added HTN medications back (metoprolol, then Amlodipine). BP remained stable throughout hospitalization. Will resume all HTN medications on discharge and FU with PCP for appropriate management.   Mild Rhabdomyolysis: CK 780 on admission, secondary to prolonged time on floor. IVF initiated on admission and CK improved. IVF discontinued on 10/11 d/t CHF history, encouraged PO intake. No complications during hospitalization.   Hypertensive Heart Disease: Chronic, mild focal septal hypertrophy in 2013 (not HOCM, but instead LVH from HTN). Echo in hospital revealed EF of 55-60% with moderate focal basal and hypertrophy of left ventricle and grade 2 diastolic dysfunction. No complications during hospitalization.  Continue to FU with cardiology for chronic management.    Chronic Diastolic CHF: Chronic, compensated. No  evidence of fluid overload during hospitalzation.   History of Bronchiectasis: Stable, no evidence of wheeze or crackles during hospitalization. Continue at-home Symbicort on discharge.   Procedures:  TTE  Consultations:  Neurology   Cardiology   Electrophysiology   Discharge Exam: Filed Vitals:   12/06/14 0545  BP: 188/75  Pulse: 77  Temp: 98.6 F (37 C)  Resp: 18     General: Well-appearing woman, laying in bed with no acute distress  Cardiovascular: RRR, no m/r/g. No peripheral edema.   Respiratory: CTA b/l, no wheeze or crackles  Abdomen: Soft, non-tender, non distended. + BS  Neuro: A&Ox3, no focal neurological deficits  Discharge Instructions   Discharge Instructions    Ambulatory referral to Neurology    Complete by:  As directed   Dr. Erlinda Hong requests followup in 2 months     Diet - low sodium heart healthy    Complete by:  As directed      Increase activity slowly    Complete by:  As directed           Current Discharge Medication List    START taking these medications   Details  aspirin 325 MG tablet Take 1 tablet (325 mg total) by mouth daily.      CONTINUE these medications which have CHANGED   Details  traMADol (ULTRAM) 50 MG tablet One every 4 hours as needed for pain or cough Qty: 15 tablet, Refills: 0      CONTINUE these medications which have NOT CHANGED   Details  atorvastatin (LIPITOR) 20 MG tablet Take 20 mg by mouth at bedtime.    budesonide-formoterol (SYMBICORT) 80-4.5 MCG/ACT inhaler Inhale 2 puffs into the lungs 2 (two) times daily. Qty: 1 Inhaler, Refills: 5    levothyroxine (SYNTHROID, LEVOTHROID) 88 MCG tablet Take 88 mcg by mouth at bedtime.    losartan (COZAAR) 100 MG tablet Take 1 tablet (100 mg total) by mouth daily. Qty: 90 tablet, Refills: 3    metoprolol succinate (TOPROL-XL) 100 MG 24 hr tablet Take 1 tablet (100 mg total) by mouth daily.    mirtazapine (REMERON) 15 MG tablet Take 15 mg by mouth at bedtime.     amLODipine (NORVASC) 2.5 MG tablet Take 1 tablet (2.5 mg total) by mouth daily.       Allergies  Allergen Reactions  . Amoxicillin Swelling    Has patient had a PCN reaction causing immediate rash, facial/tongue/throat swelling, SOB or lightheadedness with hypotension: No Has patient had a PCN reaction causing severe rash involving mucus membranes or skin necrosis: No Has patient had a PCN reaction that required hospitalization No Has patient had a PCN reaction occurring within the last 10 years: No If all of the above answers are "NO", then may proceed with Cephalosporin use.  . Ativan [Lorazepam] Other (See Comments)    Causes hyperactiviity  . Benadryl [Diphenhydramine Hcl] Other (See Comments)    Causes hyperactivity  . Lodine [Etodolac] Hives   Follow-up Information    Follow up with Xu,Jindong, MD In 2 months.   Specialty:  Neurology   Why:  Stroke Clinic, Office will call you with appointment date & time   Contact information:   [address] [place] [address] [phone]       Follow up with ARONSON,RICHARD A, MD. Schedule an appointment as soon as possible for a visit in 2  weeks.   Specialty:  Internal Medicine   Contact information:   [address] [place] [address] [phone]       Follow up with [facility].   Why:  Office will call you for a 30 day event monitor placement. If you do not hear from them-please give them a call in the next few days   Contact information:   [place] [address] [phone]       The results of significant diagnostics from this hospitalization (including imaging, microbiology, ancillary and laboratory) are listed below for reference.    Significant Diagnostic Studies: Mr Virgel Paling Wo Contrast  12/02/2014  CLINICAL DATA:  Vertigo in generalized weakness. EXAM: MRI HEAD WITHOUT CONTRAST MRA HEAD WITHOUT  CONTRAST TECHNIQUE: Multiplanar, multiecho pulse sequences of the brain and surrounding structures were obtained without intravenous contrast. Angiographic images of the head were obtained using MRA technique without contrast. COMPARISON:  Head CT 09/19/2013 FINDINGS: MRI HEAD FINDINGS Some sequences are mildly to moderately motion degraded. There is a punctate focus of cortical restricted diffusion in the right parietal lobe. There is a punctate focus of abnormal diffusion weighted signal in the left parietal cortex without clear restricted diffusion, however evaluation is limited by its small size. A similar punctate focus of diffusion abnormality is present in the subcortical white matter of the left superior frontal gyrus. There is no acute or subacute large territory infarct. There is likely a focus of chronic microhemorrhage in the left parietal lobe. There is mild generalized cerebral atrophy. T2 hyperintensities in the subcortical and deep cerebral white matter are nonspecific but compatible with mild chronic small vessel ischemic disease. There may be a tiny chronic infarct in the left cerebellum. No mass, midline shift, or extra-axial fluid collection is seen. Prior bilateral cataract extraction is noted. No significant inflammatory disease is seen in the paranasal sinuses or mastoid air cells. Major intracranial vascular flow voids are grossly preserved. MRA HEAD FINDINGS The study is moderately to severely motion degraded. The visualized distal vertebral arteries appear patent with the right being dominant. Left vertebral artery is particularly hypoplastic distal to the PICA origin. Basilar artery is patent without gross high-grade stenosis, although evaluation is limited by motion. Flow is present in the PCAs with limited evaluation for stenosis due to motion. Internal carotid arteries are patent from skullbase to carotid termini without evidence of flow limiting stenosis. A1 and M1 segments are patent.  Evaluation for stenosis and evaluation of M2 and A2 segments cannot be adequately performed due to motion artifact. IMPRESSION: 1. Punctate, acute to subacute embolic infarcts in the parietal lobes and left frontal lobe. 2. Mild cerebral atrophy and chronic small vessel ischemic disease. 3. Moderately to severely motion degraded MRA. No evidence of large vessel occlusion. Electronically Signed   By: Logan Bores M.D.   On: 12/02/2014 18:09   Mr Brain Wo Contrast  12/02/2014  CLINICAL DATA:  Vertigo in generalized weakness. EXAM: MRI HEAD WITHOUT CONTRAST MRA HEAD WITHOUT CONTRAST TECHNIQUE: Multiplanar, multiecho pulse sequences of the brain and surrounding structures were obtained without intravenous contrast. Angiographic images of the head were obtained using MRA technique without contrast. COMPARISON:  Head CT 09/19/2013 FINDINGS: MRI HEAD FINDINGS Some sequences are mildly to moderately motion degraded. There is a punctate focus of cortical restricted diffusion in the right parietal lobe. There is a punctate focus of abnormal diffusion weighted signal in the left parietal cortex without clear restricted  diffusion, however evaluation is limited by its small size. A similar punctate focus of diffusion abnormality is present in the subcortical white matter of the left superior frontal gyrus. There is no acute or subacute large territory infarct. There is likely a focus of chronic microhemorrhage in the left parietal lobe. There is mild generalized cerebral atrophy. T2 hyperintensities in the subcortical and deep cerebral white matter are nonspecific but compatible with mild chronic small vessel ischemic disease. There may be a tiny chronic infarct in the left cerebellum. No mass, midline shift, or extra-axial fluid collection is seen. Prior bilateral cataract extraction is noted. No significant inflammatory disease is seen in the paranasal sinuses or mastoid air cells. Major intracranial vascular flow voids are  grossly preserved. MRA HEAD FINDINGS The study is moderately to severely motion degraded. The visualized distal vertebral arteries appear patent with the right being dominant. Left vertebral artery is particularly hypoplastic distal to the PICA origin. Basilar artery is patent without gross high-grade stenosis, although evaluation is limited by motion. Flow is present in the PCAs with limited evaluation for stenosis due to motion. Internal carotid arteries are patent from skullbase to carotid termini without evidence of flow limiting stenosis. A1 and M1 segments are patent. Evaluation for stenosis and evaluation of M2 and A2 segments cannot be adequately performed due to motion artifact. IMPRESSION: 1. Punctate, acute to subacute embolic infarcts in the parietal lobes and left frontal lobe. 2. Mild cerebral atrophy and chronic small vessel ischemic disease. 3. Moderately to severely motion degraded MRA. No evidence of large vessel occlusion. Electronically Signed   By: Logan Bores M.D.   On: 12/02/2014 18:09    Microbiology: No results found for this or any previous visit (from the past 240 hour(s)).   Labs: Basic Metabolic Panel:  Recent Labs Lab 12/02/14 1330 12/03/14 0656 12/04/14 0553  NA 136 137 138  K 3.2* 4.4 3.6  CL 98* 101 103  CO2 28 25 26   GLUCOSE 134* 96 93  BUN 8 10 11   CREATININE 0.97 0.98 0.97  CALCIUM 8.4* 8.2* 8.2*  MG  --   --  2.3   Liver Function Tests:  Recent Labs Lab 12/02/14 1330  AST 28  ALT 13*  ALKPHOS 80  BILITOT 0.7  PROT 6.4*  ALBUMIN 2.2*   No results for input(s): LIPASE, AMYLASE in the last 168 hours. No results for input(s): AMMONIA in the last 168 hours. CBC:  Recent Labs Lab 12/02/14 1330 12/03/14 0656  WBC 9.9 10.2  NEUTROABS 8.5*  --   HGB 12.0 11.9*  HCT 38.2 37.0  MCV 86.6 86.4  PLT 246 208   Cardiac Enzymes:  Recent Labs Lab 12/02/14 1330 12/03/14 1209 12/04/14 0553  CKTOTAL 780* 692* 274*  CKMB 4.7  --   --     BNP: BNP (last 3 results) No results for input(s): BNP in the last 8760 hours.  ProBNP (last 3 results) No results for input(s): PROBNP in the last 8760 hours.  CBG: No results for input(s): GLUCAP in the last 168 hours.  Signed:  Carloyn Manner PA-C [facility] 12/06/2014, 10:02 AM  Attending MD note  Patient was seen, examined,treatment plan was discussed with the PA-S.  I have personally reviewed the clinical findings, lab, imaging studies and management of this patient in detail. I agree with the documentation, as recorded by the PA-S.   Doing well this morning-no focal neurological deficits-awake and alert-daughter-in-law at bedside. Stroke workup completed-no further recommendations from neurology-cardiology  will arrange for a 30 day event monitor-if that is negative-then cardiology will plan on loop recorder implantation. Seen by CIR-felt to be more appropriate for SNF. Stable to be discharged to SNF today. Rest as above   [facility]

## 2014-12-06 NOTE — Progress Notes (Signed)
Pt discharged to [facility], skilled nursing facility. Discharge information given. IV removed. Report called to [place]. [facility] to transport at 1400. Wendee Copp

## 2014-12-06 NOTE — Clinical Social Work Placement (Signed)
   CLINICAL SOCIAL WORK PLACEMENT  NOTE  Date:  12/06/2014  Patient Details  Name: Laurie [name] MRN: [id number] Date of Birth: 12-03-1924  Clinical Social Work is seeking post-discharge placement for this patient at the [place] level of care (*CSW will initial, date and re-position this form in  chart as items are completed):  Yes   Patient/family provided with [place] Work Department's list of facilities offering this level of care within the geographic area requested by the patient (or if unable, by the patient's family).  Yes   Patient/family informed of their freedom to choose among providers that offer the needed level of care, that participate in Medicare, Medicaid or managed care program needed by the patient, have an available bed and are willing to accept the patient.  Yes   Patient/family informed of [facility]'s ownership interest in [facility] and [facility], as well as of the fact that they are under no obligation to receive care at these facilities.  PASRR submitted to EDS on       PASRR number received on       Existing PASRR number confirmed on       FL2 transmitted to all facilities in geographic area requested by pt/family on 12/05/14     FL2 transmitted to all facilities within larger geographic area on       Patient informed that his/her managed care company has contracts with or will negotiate with certain facilities, including the following:        Yes   Patient/family informed of bed offers received.  Patient chooses bed at [facility]     Physician recommends and patient chooses bed at      Patient to be transferred to [facility] on 12/06/14.  Patient to be transferred to facility by PTAR      Patient family notified on 12/06/14 of transfer.  Name of family member notified:  Zepeda,Lynn, daughter in law     PHYSICIAN       Additional Comment:     _______________________________________________ Greta Doom, LCSW 12/06/2014, 10:51 AM

## 2014-12-06 NOTE — Progress Notes (Signed)
Physical Therapy Treatment Patient Details Name: Laurie [name] MRN: [id number] DOB: 1924-06-27 Today's Date: 12/06/2014    History of Present Illness Laurie [name] is a 81 y.o. female with a past medical history of hypertension, dyslipidemia, coronary artery disease, admitted after being found on the ground by daughter. MRI revealed Punctate, acute to subacute embolic infarcts in the parietal lobes and left frontal lobe.    PT Comments    Progressing with ambulation distance and balance this session.  Fatigued with ambulation needing increased assist for balance and bed mobility.  Family in room report planned d/c later today to SNF.  Will benefit from follow up PT in skilled facility.  Follow Up Recommendations  SNF     Equipment Recommendations  Other (comment) (TBA)    Recommendations for Other Services       Precautions / Restrictions Precautions Precautions: Fall    Mobility  Bed Mobility         Supine to sit: HOB elevated;Supervision Sit to supine: Mod assist   General bed mobility comments: increased time with rail to come upright, then assist with legs to supine  Transfers Overall transfer level: Needs assistance Equipment used: Rolling walker (2 wheeled) Transfers: Sit to/from Stand Sit to Stand: Mod assist         General transfer comment: lifting assist to stand  Ambulation/Gait Ambulation/Gait assistance: Min assist Ambulation Distance (Feet): 75 Feet (x 2) Assistive device: Rolling walker (2 wheeled) Gait Pattern/deviations: Step-through pattern;Narrow base of support;Leaning posteriorly     General Gait Details: initially with good balance and heel strik, then with posterior bias after fatigued; increased assist for walker management for turns   Stairs            Wheelchair Mobility    Modified Rankin (Stroke Patients Only)       Balance     Sitting balance-Leahy Scale: Fair Sitting balance - Comments: no UE support       Standing balance-Leahy Scale: Poor Standing balance comment: needs assist with walker for balance                    Cognition Arousal/Alertness: Awake/alert Behavior During Therapy: WFL for tasks assessed/performed Overall Cognitive Status: Impaired/Different from baseline   Orientation Level: Disoriented to;Place;Time   Memory: Decreased short-term memory Following Commands: Follows one step commands with increased time     Problem Solving: Decreased initiation;Slow processing;Requires verbal cues      Exercises      General Comments        Pertinent Vitals/Pain Pain Assessment: Faces Faces Pain Scale: Hurts little more Pain Location: lower back with certain positinos Pain Descriptors / Indicators: Aching Pain Intervention(s): Limited activity within patient's tolerance;Monitored during session;Repositioned    Home Living                      Prior Function            PT Goals (current goals can now be found in the care plan section) Progress towards PT goals: Progressing toward goals    Frequency  Min 3X/week    PT Plan Discharge plan needs to be updated;Frequency needs to be updated    Co-evaluation             End of Session Equipment Utilized During Treatment: Gait belt Activity Tolerance: Patient tolerated treatment well Patient left: in bed;with call bell/phone within reach;with family/visitor present     Time: 7672-0947 PT Time  Calculation (min) (ACUTE ONLY): 25 min  Charges:  $Gait Training: 8-22 mins $Therapeutic Activity: 8-22 mins                    G Codes:      Laurie [name],Laurie [name] 2014/12/15, 11:42 AM  Magda Kiel, PT [phone] December 15, 2014

## 2014-12-08 ENCOUNTER — Other Ambulatory Visit: Payer: Self-pay | Admitting: Nurse Practitioner

## 2014-12-08 ENCOUNTER — Ambulatory Visit (INDEPENDENT_AMBULATORY_CARE_PROVIDER_SITE_OTHER): Payer: Commercial Managed Care - HMO

## 2014-12-08 DIAGNOSIS — R42 Dizziness and giddiness: Secondary | ICD-10-CM

## 2014-12-08 DIAGNOSIS — I639 Cerebral infarction, unspecified: Secondary | ICD-10-CM | POA: Diagnosis not present

## 2014-12-10 ENCOUNTER — Other Ambulatory Visit
Admission: RE | Admit: 2014-12-10 | Discharge: 2014-12-10 | Disposition: A | Discharge status: Home / Self Care | Payer: Commercial Managed Care - HMO | Source: Ambulatory Visit | Attending: Internal Medicine | Admitting: Internal Medicine

## 2014-12-10 DIAGNOSIS — M545 Low back pain: Secondary | ICD-10-CM | POA: Insufficient documentation

## 2014-12-10 DIAGNOSIS — R350 Frequency of micturition: Secondary | ICD-10-CM | POA: Diagnosis not present

## 2014-12-10 LAB — URINALYSIS COMPLETE WITH MICROSCOPIC (ARMC ONLY)
BILIRUBIN URINE: NEGATIVE
Glucose, UA: NEGATIVE mg/dL
Hgb urine dipstick: NEGATIVE
KETONES UR: NEGATIVE mg/dL
Leukocytes, UA: NEGATIVE
Nitrite: NEGATIVE
Protein, ur: NEGATIVE mg/dL
Specific Gravity, Urine: 1.02 (ref 1.005–1.030)
pH: 5 (ref 5.0–8.0)

## 2014-12-12 ENCOUNTER — Telehealth: Payer: Self-pay | Admitting: Cardiovascular Disease

## 2014-12-12 LAB — URINE CULTURE

## 2014-12-12 NOTE — Telephone Encounter (Signed)
Routing to [employer], NP for EP for awareness.  It appears patient is in SNF.

## 2014-12-12 NOTE — Telephone Encounter (Signed)
Spoke with daughter-in-law and drop in blood pressure has been addressed by SNF physician (meds decreased she thinks) Did encourage to have patient continue to wear monitor as much as possible.  Will try to get results of  monitor from over the weekend and have it reviewed but if anything critical office should have been notified  Advised wold probably be tomorrow before get them and they are reviewed

## 2014-12-12 NOTE — Telephone Encounter (Signed)
New Message   Yesterday afternoon at 4 pt removed monitor and the daughter n law   tried to put it back on and the patient refused  Pt daughter wants Dr. Johnsie Cancel aware

## 2014-12-12 NOTE — Telephone Encounter (Signed)
New Message  Pt daughter called to follow Up. Bp is "tanking" when she is lying down it raisies. Pt is in rehab. Trying to decide if the patient should continue to wear the monitor

## 2014-12-13 NOTE — Telephone Encounter (Signed)
Spoke with [employer] S NP and she had reviewed, monitor stable at this point Advised daughter-in-law and encouraged her to have patient wear monitor as much as possible, verbalized understanding

## 2014-12-15 ENCOUNTER — Other Ambulatory Visit: Payer: Self-pay | Admitting: Gerontology

## 2014-12-15 DIAGNOSIS — R05 Cough: Secondary | ICD-10-CM

## 2014-12-15 DIAGNOSIS — R059 Cough, unspecified: Secondary | ICD-10-CM

## 2014-12-15 DIAGNOSIS — R9389 Abnormal findings on diagnostic imaging of other specified body structures: Secondary | ICD-10-CM

## 2014-12-18 ENCOUNTER — Ambulatory Visit: Payer: Commercial Managed Care - HMO

## 2014-12-20 ENCOUNTER — Ambulatory Visit
Admission: RE | Admit: 2014-12-20 | Discharge: 2014-12-20 | Disposition: A | Discharge status: Home / Self Care | Payer: Commercial Managed Care - HMO | Source: Ambulatory Visit | Attending: Gerontology | Admitting: Gerontology

## 2014-12-20 DIAGNOSIS — Z09 Encounter for follow-up examination after completed treatment for conditions other than malignant neoplasm: Secondary | ICD-10-CM | POA: Diagnosis present

## 2014-12-20 DIAGNOSIS — R918 Other nonspecific abnormal finding of lung field: Secondary | ICD-10-CM | POA: Insufficient documentation

## 2014-12-20 DIAGNOSIS — R059 Cough, unspecified: Secondary | ICD-10-CM

## 2014-12-20 DIAGNOSIS — R9389 Abnormal findings on diagnostic imaging of other specified body structures: Secondary | ICD-10-CM

## 2014-12-20 DIAGNOSIS — R05 Cough: Secondary | ICD-10-CM

## 2014-12-20 DIAGNOSIS — R911 Solitary pulmonary nodule: Secondary | ICD-10-CM | POA: Diagnosis present

## 2014-12-20 MED ORDER — IOHEXOL 300 MG/ML  SOLN
75.0000 mL | Freq: Once | INTRAMUSCULAR | Status: AC | PRN
  Administered 2014-12-20: 75 mL via INTRAVENOUS

## 2014-12-26 ENCOUNTER — Encounter
Admission: RE | Admit: 2014-12-26 | Discharge: 2014-12-26 | Disposition: A | Discharge status: Home / Self Care | Payer: Commercial Managed Care - HMO | Source: Ambulatory Visit | Attending: Internal Medicine | Admitting: Internal Medicine

## 2015-01-22 ENCOUNTER — Telehealth: Payer: Self-pay | Admitting: Cardiovascular Disease

## 2015-01-22 NOTE — Telephone Encounter (Signed)
New Message  Pt daughter called to cancell f/u w/ Amber, wanted the results of recent holter monitor. Please call back and discuss.

## 2015-01-22 NOTE — Telephone Encounter (Signed)
Called pt's daughter and gave her the the results of the recent holter monitor.

## 2015-01-24 ENCOUNTER — Ambulatory Visit: Payer: Commercial Managed Care - HMO | Admitting: Nurse Practitioner

## 2015-02-14 IMAGING — CR DG CHEST 1V PORT
1 series · 1 of 1 positions shown · non-contrast
Comparison: 04/12/2012 and CT of 04/02/2012.

CLINICAL DATA: Status post bronchoscopy.  Hemoptysis.

PORTABLE CHEST - 1 VIEW

[AP]
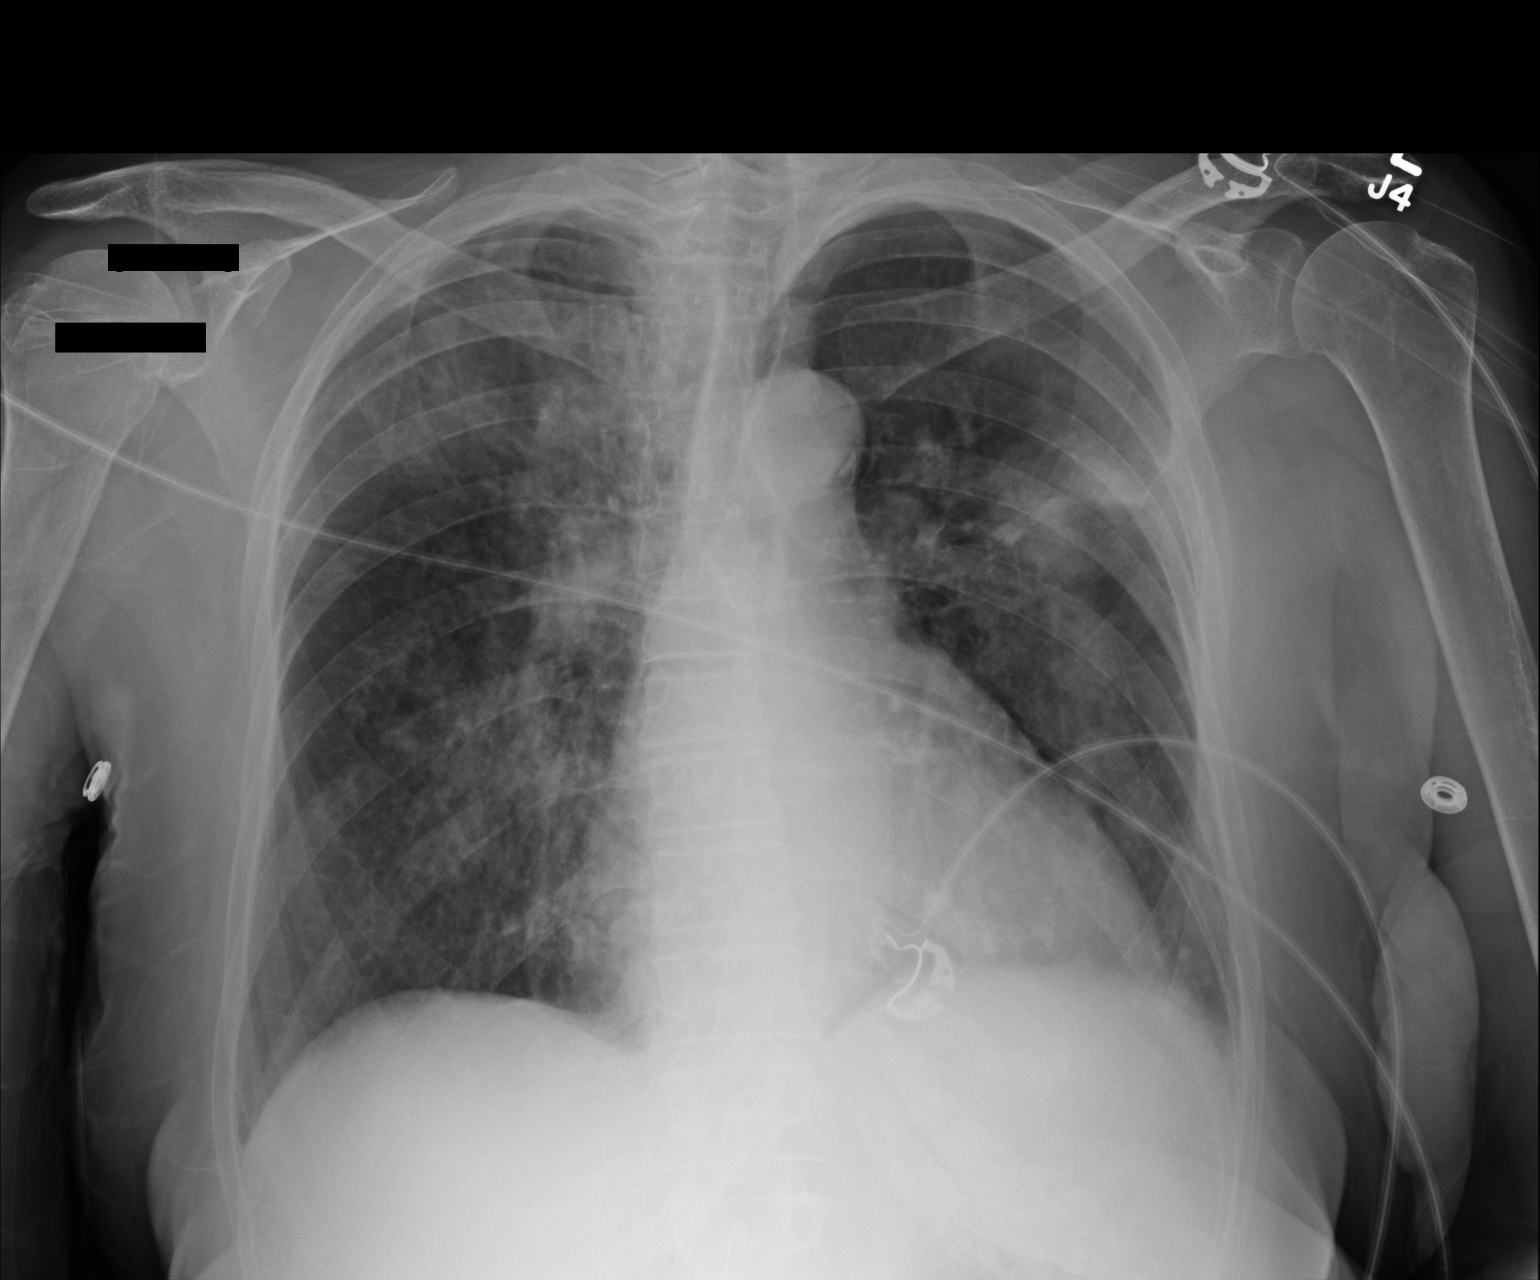

[1 of 1 positions shown; findings below may reference images not displayed]

FINDINGS: Patient rotated minimally left.  Mild cardiomegaly with
atherosclerosis in the transverse aorta. No pleural effusion or
pneumothorax.  No pneumomediastinum.  Slightly upper lobe
predominant pulmonary opacities are grossly similar to on the prior
exam.  No convincing evidence of acute superimposed process.
IMPRESSION: No pneumothorax or other acute complication after bronchoscopy.

Upper lobe predominant pulmonary opacities, as detailed on
04/02/2012 CT.

## 2015-02-14 IMAGING — CR DG CHEST 2V
2 series · 2 of 2 positions shown · non-contrast
Comparison: Chest CT 04/02/2012

CLINICAL DATA: Preop evaluation for lung biopsy.

CHEST - 2 VIEW

[w chest pa]
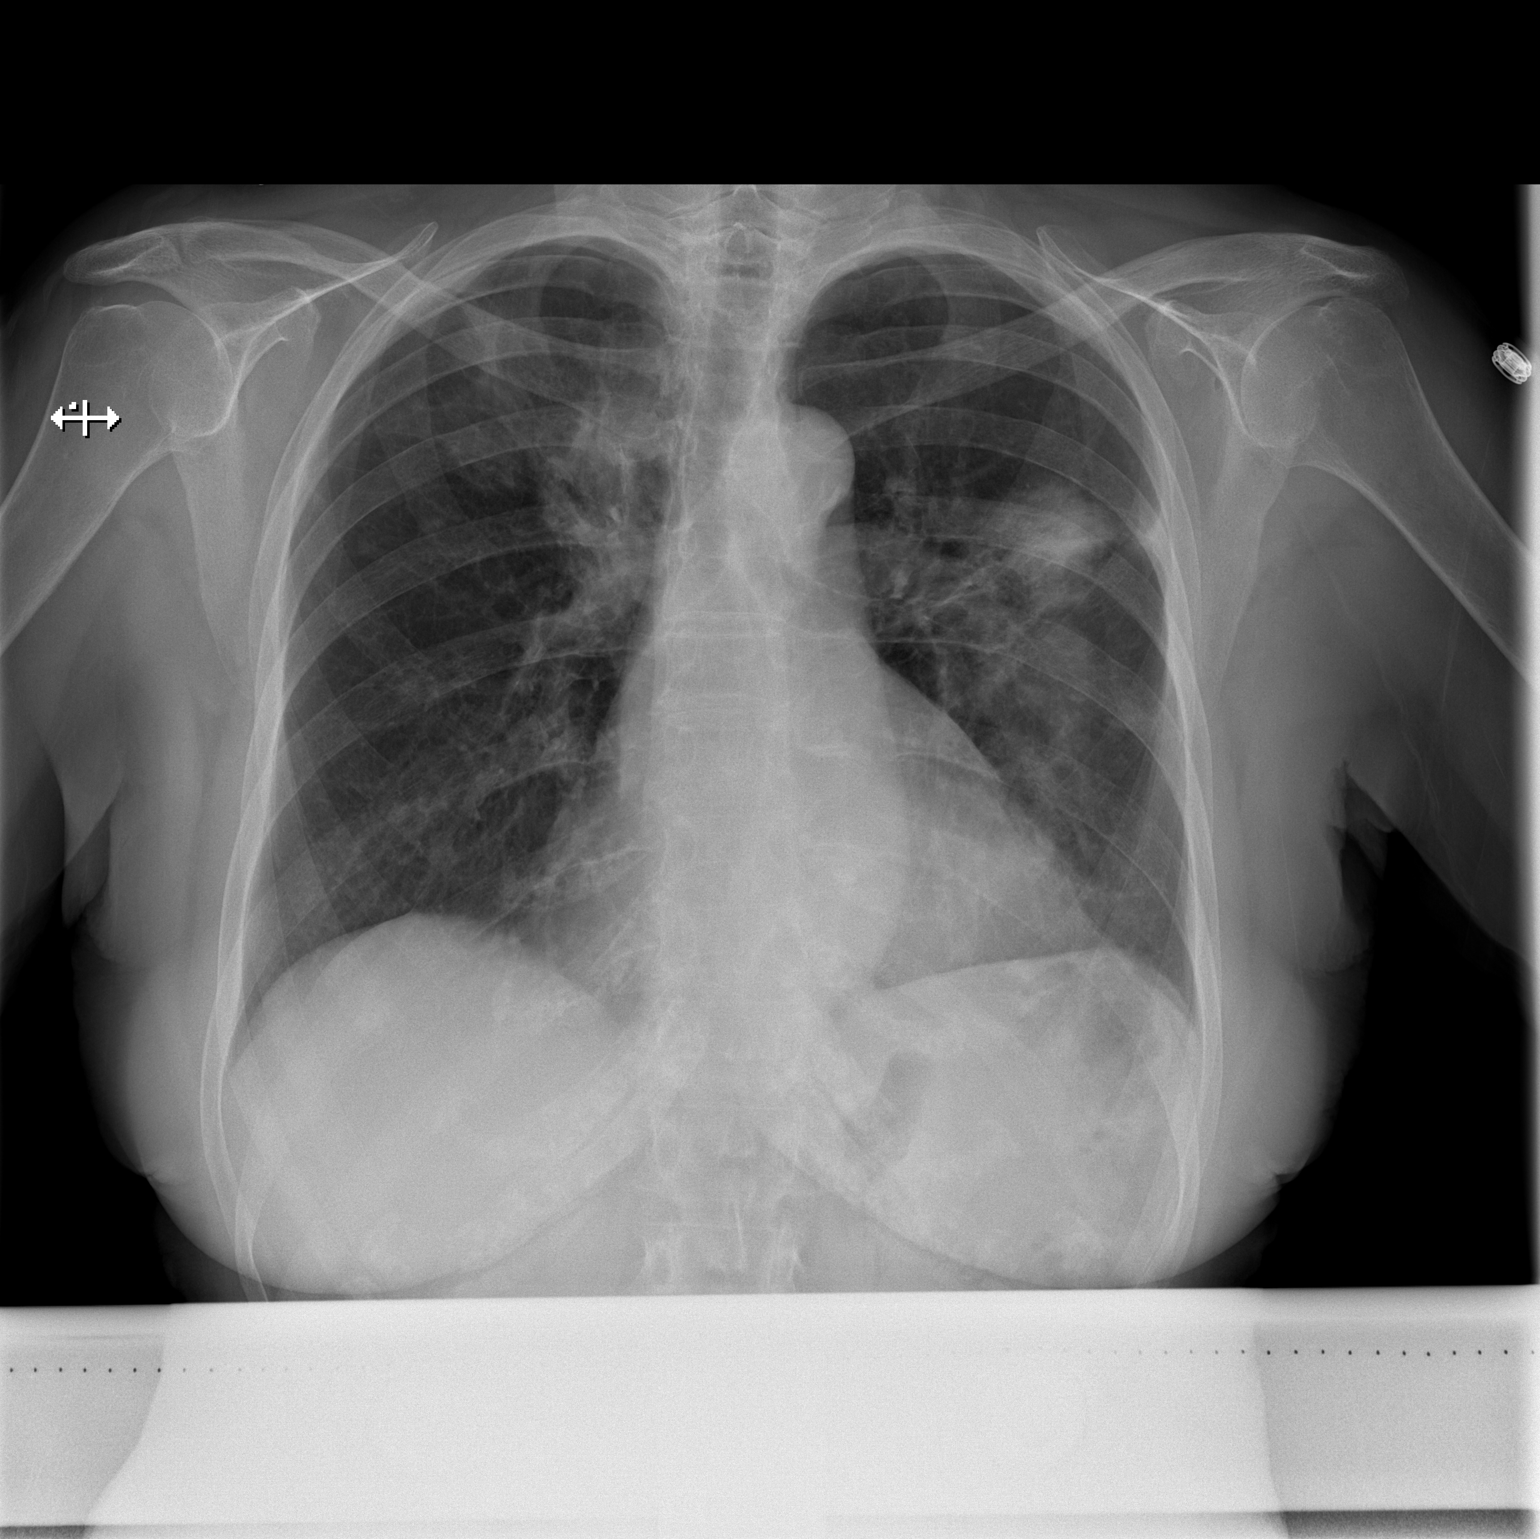

[w chest lat]
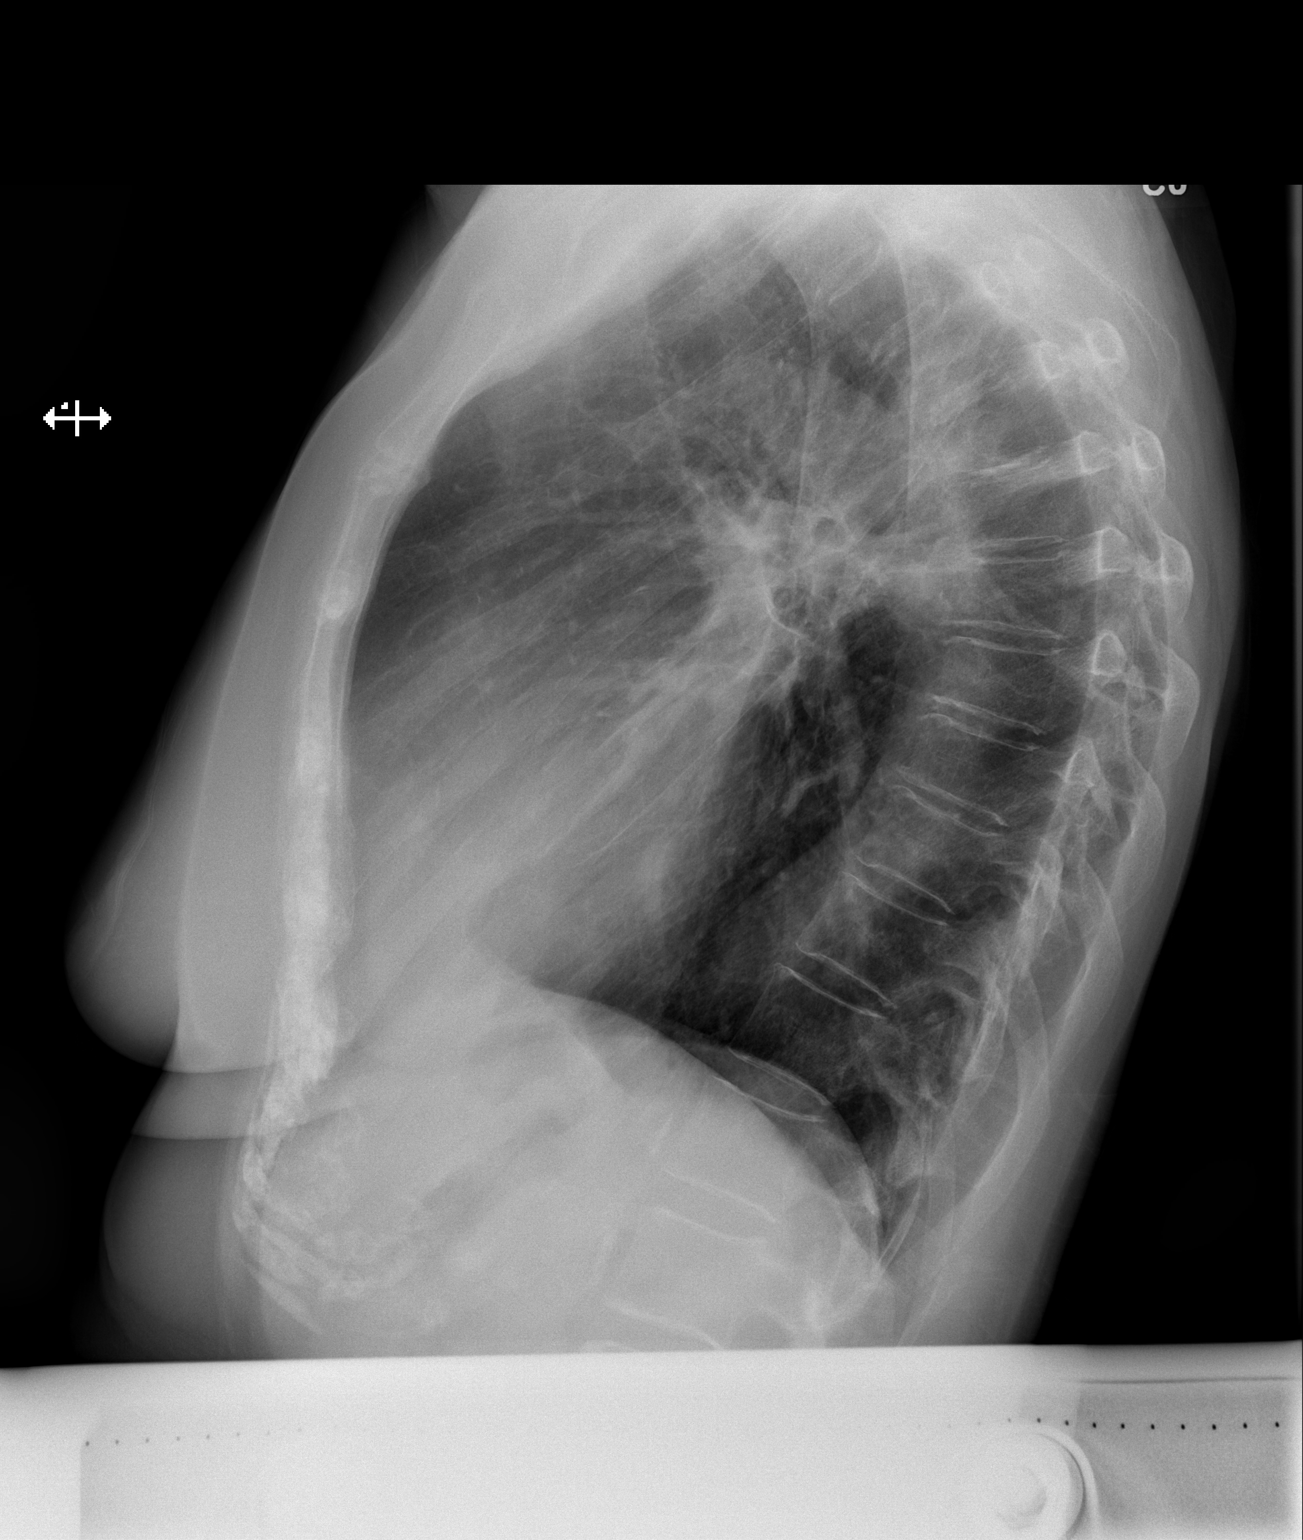

[2 of 2 positions shown; findings below may reference images not displayed]

FINDINGS: Two views of the chest were obtained.  Again noted are
opacities in the medial right upper chest and left upper lung.
These opacities correspond with the lesions on the recent chest CT.
Heart size is within normal limits.  There are additional patchy
densities in the left lower lobe. There are small nodular densities
scattered throughout both lungs.  No evidence for pleural
effusions.  Bony thorax is intact.
IMPRESSION: Stable appearance of the bilateral parenchymal lung
disease and lung nodules.  No significant change since the prior
chest CT.

## 2015-02-15 IMAGING — CR DG CHEST 1V PORT
1 series · 1 of 1 positions shown · non-contrast
Comparison: Chest x-ray 04/12/2012.  Chest CT 04/02/2012.

CLINICAL DATA: Cough.  Status post bronchoscopy on 04/12/2012.

PORTABLE CHEST - 1 VIEW

[AP]
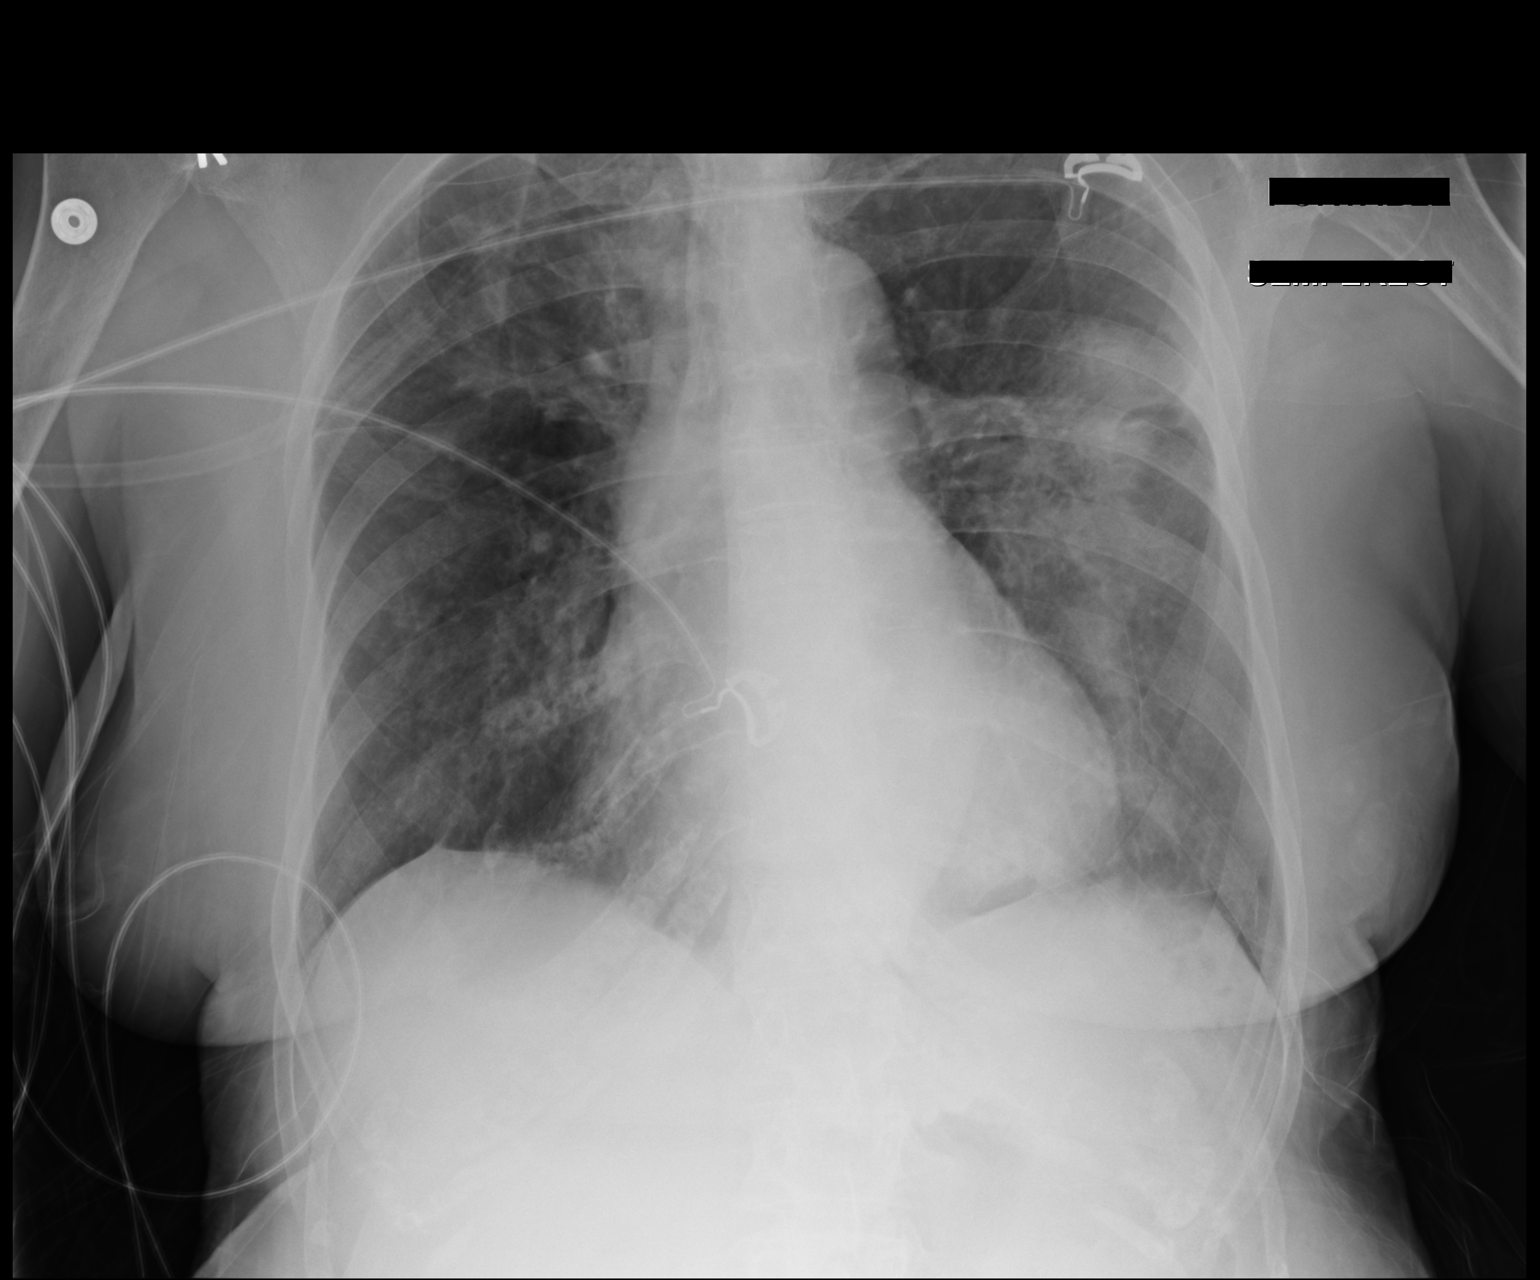

[1 of 1 positions shown; findings below may reference images not displayed]

FINDINGS: Multifocal nodular opacities are again noted in the lungs
bilaterally.  Ill defined areas of airspace consolidation in the
right upper lobe and in the left midlung are similar to the prior
examination, however, compared to the recent prior study there is
more ill defined opacity throughout the left mid and lower lung.
Pulmonary vasculature is within normal limits.  Heart size is
normal.  The upper mediastinal contours are within normal limits.
Atherosclerosis in the thoracic aorta.
IMPRESSION: 1.  Worsening ill-defined opacity in the left mid to lower lung may
represent some retained lavage fluid given the recent bronchoscopy.
Attention on follow-up studies is recommended to exclude a
developing area of infection.  The appearance of chest is otherwise
essentially unchanged, as above.

## 2015-02-25 DEATH — deceased
